# Patient Record
Sex: Female | Born: 1990
Health system: Southern US, Community
[De-identification: ages and names within clinical notes are randomized; demographics above are authoritative.]

## PROBLEM LIST (undated history)

## (undated) DIAGNOSIS — Z789 Other specified health status: Secondary | ICD-10-CM

## (undated) DIAGNOSIS — B009 Herpesviral infection, unspecified: Secondary | ICD-10-CM

## (undated) HISTORY — DX: Herpesviral infection, unspecified: B00.9

## (undated) HISTORY — DX: Other specified health status: Z78.9

## (undated) HISTORY — PX: NO PAST SURGERIES: SHX2092

---
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
## 2012-05-03 NOTE — L&D Delivery Note (Signed)
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  Presentation: Vertex; LOA; Position: Left,, Occiput,, Anterior; Station: +2.  Verbal consent: obtained from patient.  Risks and benefits discussed in detail.  Risks include, but are not limited to the risks of anesthesia, bleeding, infection, damage to maternal tissues, fetal cephalhematoma.  There is also the risk of inability to effect vaginal delivery of the head, or shoulder dystocia that cannot be resolved by established maneuvers, leading to the need for emergency cesarean section.  APGAR: 8, 9; weight .   Placenta status: Intact, Spontaneous.   Cord: 3 vessels with the following complications: None.   Anesthesia: Epidural  Instruments: Kiwi vacuum Episiotomy: None Lacerations: 2nd degree;Perineal;Labial Suture Repair: 3.0 vicryl on CT for 2nd degree, 4.0 vicryl on SH for R labial Est. Blood Loss (mL): 350  Mom to postpartum.  Baby to nursery-stable.  VAVD over 2nd degree tear. Active management of 3rd stage with pit and traction. Spon delivery of intact placenta w/ 3v cord. Repaired 2nd degree in usual manner. 3.0 V on CT and repaired labial with 4.0 V on SH with running Subq. Hemostatic at completion. EBL 350. Significant swelling of perineum.  Tawana Scale 01/04/2013, 9:21 AM

## 2012-05-03 NOTE — L&D Delivery Note (Signed)
Attended delivery and agree  Adam Phenix, MD 01/05/2013

## 2012-05-17 LAB — OB RESULTS CONSOLE ABO/RH: RH Type: POSITIVE

## 2012-05-17 LAB — OB RESULTS CONSOLE VARICELLA ZOSTER ANTIBODY, IGG: Varicella: IMMUNE

## 2012-05-17 LAB — OB RESULTS CONSOLE RUBELLA ANTIBODY, IGM: Rubella: NON-IMMUNE/NOT IMMUNE

## 2012-05-17 LAB — OB RESULTS CONSOLE GC/CHLAMYDIA
Chlamydia: NEGATIVE
Gonorrhea: NEGATIVE

## 2012-05-17 LAB — OB RESULTS CONSOLE RPR: RPR: NONREACTIVE

## 2012-05-17 LAB — OB RESULTS CONSOLE ANTIBODY SCREEN: Antibody Screen: NEGATIVE

## 2012-05-17 LAB — OB RESULTS CONSOLE HEPATITIS B SURFACE ANTIGEN: Hepatitis B Surface Ag: NEGATIVE

## 2012-07-05 ENCOUNTER — Encounter: Payer: Self-pay | Admitting: *Deleted

## 2012-07-26 ENCOUNTER — Other Ambulatory Visit: Payer: Self-pay | Admitting: Advanced Practice Midwife

## 2012-07-26 ENCOUNTER — Telehealth: Payer: Self-pay | Admitting: Obstetrics & Gynecology

## 2012-07-26 ENCOUNTER — Encounter: Payer: Self-pay | Admitting: Advanced Practice Midwife

## 2012-07-26 ENCOUNTER — Ambulatory Visit (INDEPENDENT_AMBULATORY_CARE_PROVIDER_SITE_OTHER): Payer: Medicaid Other | Admitting: Obstetrics & Gynecology

## 2012-07-26 VITALS — BP 120/60 | Wt 126.0 lb

## 2012-07-26 DIAGNOSIS — A6000 Herpesviral infection of urogenital system, unspecified: Secondary | ICD-10-CM

## 2012-07-26 DIAGNOSIS — Z3400 Encounter for supervision of normal first pregnancy, unspecified trimester: Secondary | ICD-10-CM | POA: Insufficient documentation

## 2012-07-26 DIAGNOSIS — Z3402 Encounter for supervision of normal first pregnancy, second trimester: Secondary | ICD-10-CM

## 2012-07-26 DIAGNOSIS — O98519 Other viral diseases complicating pregnancy, unspecified trimester: Secondary | ICD-10-CM

## 2012-07-26 DIAGNOSIS — Z1389 Encounter for screening for other disorder: Secondary | ICD-10-CM

## 2012-07-26 DIAGNOSIS — Z331 Pregnant state, incidental: Secondary | ICD-10-CM

## 2012-07-26 LAB — POCT URINALYSIS DIPSTICK
Glucose, UA: NEGATIVE
Ketones, UA: NEGATIVE
Nitrite, UA: NEGATIVE
Protein, UA: NEGATIVE

## 2012-07-26 MED ORDER — ACYCLOVIR 200 MG PO CAPS: 400.0000 mg | ORAL_CAPSULE | Freq: Three times a day (TID) | ORAL | Status: DC

## 2012-07-26 NOTE — Progress Notes (Signed)
Also requesting refill for acyclovir

## 2012-07-26 NOTE — Progress Notes (Signed)
Encouraged powdered supplements if no appetite.  Getting over 2nd HSV outbreak in 3 months.  Wants daily suppressive therapy.  No medicaid yet, so will rx acycolvir TID for now, then change to BID valtrex for compliance issues.    No c/o at this time.  Routine questions about pregnancy andswered.  F/U in 3 weeks for anatomy scan.

## 2012-08-01 LAB — MATERNAL SCREEN, INTEGRATED #2
AFP MoM: 0.8
AFP, Serum: 37.4 ng/mL
Age risk Down Syndrome: 1:1100 {titer}
Calculated Gestational Age: 17
Crown Rump Length: 69.1 mm
Estriol Mom: 1.15
Estriol, Free: 0.91 ng/mL
Inhibin A Dimeric: 165 pg/mL
Inhibin A MoM: 0.9
MSS Down Syndrome: <1:5000 {titer}
MSS Trisomy 18 Risk: <1:5000 {titer}
Maternal weight: 126 [lb_av]
NT MoM: 0.93
Nuchal Translucency: 1.43 mm
Number of fetuses: 1
PAPP-A MoM: 1.38
PAPP-A: 1857 ng/mL
Referring Physician NPI: 1881783975
Referring Physician Phone: 3363426063
Rish for ONTD: <1:5000 {titer}
hCG MoM: 0.16
hCG, Serum: 3.7 IU/mL

## 2012-08-09 ENCOUNTER — Ambulatory Visit: Payer: Self-pay | Admitting: Obstetrics & Gynecology

## 2012-08-17 ENCOUNTER — Other Ambulatory Visit: Payer: Self-pay

## 2012-08-17 ENCOUNTER — Ambulatory Visit (INDEPENDENT_AMBULATORY_CARE_PROVIDER_SITE_OTHER): Payer: Medicaid Other

## 2012-08-17 ENCOUNTER — Encounter: Payer: Self-pay | Admitting: Obstetrics and Gynecology

## 2012-08-17 ENCOUNTER — Ambulatory Visit (INDEPENDENT_AMBULATORY_CARE_PROVIDER_SITE_OTHER): Payer: Medicaid Other | Admitting: Obstetrics and Gynecology

## 2012-08-17 VITALS — BP 102/70 | Wt 127.4 lb

## 2012-08-17 DIAGNOSIS — Z3402 Encounter for supervision of normal first pregnancy, second trimester: Secondary | ICD-10-CM

## 2012-08-17 DIAGNOSIS — Z3400 Encounter for supervision of normal first pregnancy, unspecified trimester: Secondary | ICD-10-CM

## 2012-08-17 DIAGNOSIS — O98519 Other viral diseases complicating pregnancy, unspecified trimester: Secondary | ICD-10-CM

## 2012-08-17 DIAGNOSIS — O99330 Smoking (tobacco) complicating pregnancy, unspecified trimester: Secondary | ICD-10-CM

## 2012-08-17 DIAGNOSIS — A6000 Herpesviral infection of urogenital system, unspecified: Secondary | ICD-10-CM

## 2012-08-17 DIAGNOSIS — Z331 Pregnant state, incidental: Secondary | ICD-10-CM

## 2012-08-17 DIAGNOSIS — Z1389 Encounter for screening for other disorder: Secondary | ICD-10-CM

## 2012-08-17 LAB — US OB COMP + 14 WK
Abdominal Circumference: 14.01 cm
Biparietal Diameter: 4.54 cm
Femur Length: 3.22
Head Circumference: 17.43 cm

## 2012-08-17 NOTE — Progress Notes (Signed)
Pt unable to void for urine samples, fetal u/s reviewed, appropriate anatomy scan.consistent with current dating

## 2012-08-17 NOTE — Progress Notes (Signed)
U/S(19+5wks)-single active fetus, meas c/w dates, fluid wnl, ant gr 0 plac, cx long and closed, bilateral adnexa wnl, no major abnl noted, female fetus

## 2012-09-14 ENCOUNTER — Ambulatory Visit (INDEPENDENT_AMBULATORY_CARE_PROVIDER_SITE_OTHER): Payer: Medicaid Other | Admitting: Advanced Practice Midwife

## 2012-09-14 ENCOUNTER — Encounter: Payer: Self-pay | Admitting: Advanced Practice Midwife

## 2012-09-14 VITALS — BP 100/60 | Wt 131.0 lb

## 2012-09-14 DIAGNOSIS — Z331 Pregnant state, incidental: Secondary | ICD-10-CM

## 2012-09-14 DIAGNOSIS — Z3402 Encounter for supervision of normal first pregnancy, second trimester: Secondary | ICD-10-CM

## 2012-09-14 DIAGNOSIS — Z1389 Encounter for screening for other disorder: Secondary | ICD-10-CM

## 2012-09-14 DIAGNOSIS — O98519 Other viral diseases complicating pregnancy, unspecified trimester: Secondary | ICD-10-CM

## 2012-09-14 LAB — POCT URINALYSIS DIPSTICK
Blood, UA: NEGATIVE
Glucose, UA: NEGATIVE
Ketones, UA: NEGATIVE
Nitrite, UA: NEGATIVE
Protein, UA: NEGATIVE

## 2012-09-14 NOTE — Progress Notes (Signed)
No c/o at this time.  Routine questions about pregnancy answered.  F/U in 4 weeks for PN2/LROB.  

## 2012-09-14 NOTE — Patient Instructions (Signed)
Nothing to eat or drink after midnight before your next appointment & plan to be here for 2 hours (for your sugar test).  

## 2012-10-12 ENCOUNTER — Other Ambulatory Visit: Payer: Medicaid Other

## 2012-10-12 ENCOUNTER — Ambulatory Visit (INDEPENDENT_AMBULATORY_CARE_PROVIDER_SITE_OTHER): Payer: Medicaid Other | Admitting: Adult Health

## 2012-10-12 ENCOUNTER — Encounter: Payer: Self-pay | Admitting: Adult Health

## 2012-10-12 VITALS — BP 94/62 | Wt 135.0 lb

## 2012-10-12 DIAGNOSIS — Z331 Pregnant state, incidental: Secondary | ICD-10-CM

## 2012-10-12 DIAGNOSIS — Z3400 Encounter for supervision of normal first pregnancy, unspecified trimester: Secondary | ICD-10-CM

## 2012-10-12 DIAGNOSIS — O98519 Other viral diseases complicating pregnancy, unspecified trimester: Secondary | ICD-10-CM

## 2012-10-12 DIAGNOSIS — Z1389 Encounter for screening for other disorder: Secondary | ICD-10-CM

## 2012-10-12 LAB — CBC
HCT: 33.2 % — ABNORMAL LOW (ref 36.0–46.0)
Hemoglobin: 11.4 g/dL — ABNORMAL LOW (ref 12.0–15.0)
MCH: 31.8 pg (ref 26.0–34.0)
MCHC: 34.3 g/dL (ref 30.0–36.0)
MCV: 92.5 fL (ref 78.0–100.0)
Platelets: 248 10*3/uL (ref 150–400)
RBC: 3.59 MIL/uL — ABNORMAL LOW (ref 3.87–5.11)
RDW: 13.4 % (ref 11.5–15.5)
WBC: 13.1 10*3/uL — ABNORMAL HIGH (ref 4.0–10.5)

## 2012-10-12 LAB — POCT URINALYSIS DIPSTICK
Blood, UA: NEGATIVE
Glucose, UA: NEGATIVE
Ketones, UA: NEGATIVE
Leukocytes, UA: NEGATIVE
Nitrite, UA: NEGATIVE
Protein, UA: NEGATIVE

## 2012-10-12 NOTE — Progress Notes (Signed)
PN 2 today, no c/o at this time.

## 2012-10-12 NOTE — Progress Notes (Signed)
No bleeding or discharge. Has good fetal movement, no complaints.F/U 4 weeks.

## 2012-10-12 NOTE — Patient Instructions (Addendum)
Pregnancy - Second Trimester The second trimester of pregnancy (3 to 6 months) is a period of rapid growth for you and your baby. At the end of the sixth month, your baby is about 9 inches long and weighs 1 1/2 pounds. You will begin to feel the baby move between 18 and 20 weeks of the pregnancy. This is called quickening. Weight gain is faster. A clear fluid (colostrum) may leak out of your breasts. You may feel small contractions of the womb (uterus). This is known as false labor or Braxton-Hicks contractions. This is like a practice for labor when the baby is ready to be born. Usually, the problems with morning sickness have usually passed by the end of your first trimester. Some women develop small dark blotches (called cholasma, mask of pregnancy) on their face that usually goes away after the baby is born. Exposure to the sun makes the blotches worse. Acne may also develop in some pregnant women and pregnant women who have acne, may find that it goes away. PRENATAL EXAMS  Blood work may continue to be done during prenatal exams. These tests are done to check on your health and the probable health of your baby. Blood work is used to follow your blood levels (hemoglobin). Anemia (low hemoglobin) is common during pregnancy. Iron and vitamins are given to help prevent this. You will also be checked for diabetes between 24 and 28 weeks of the pregnancy. Some of the previous blood tests may be repeated.  The size of the uterus is measured during each visit. This is to make sure that the baby is continuing to grow properly according to the dates of the pregnancy.  Your blood pressure is checked every prenatal visit. This is to make sure you are not getting toxemia.  Your urine is checked to make sure you do not have an infection, diabetes or protein in the urine.  Your weight is checked often to make sure gains are happening at the suggested rate. This is to ensure that both you and your baby are growing  normally.  Sometimes, an ultrasound is performed to confirm the proper growth and development of the baby. This is a test which bounces harmless sound waves off the baby so your caregiver can more accurately determine due dates. Sometimes, a test is done on the amniotic fluid surrounding the baby. This test is called an amniocentesis. The amniotic fluid is obtained by sticking a needle into the belly (abdomen). This is done to check the chromosomes in instances where there is a concern about possible genetic problems with the baby. It is also sometimes done near the end of pregnancy if an early delivery is required. In this case, it is done to help make sure the baby's lungs are mature enough for the baby to live outside of the womb. CHANGES OCCURING IN THE SECOND TRIMESTER OF PREGNANCY Your body goes through many changes during pregnancy. They vary from person to person. Talk to your caregiver about changes you notice that you are concerned about.  During the second trimester, you will likely have an increase in your appetite. It is normal to have cravings for certain foods. This varies from person to person and pregnancy to pregnancy.  Your lower abdomen will begin to bulge.  You may have to urinate more often because the uterus and baby are pressing on your bladder. It is also common to get more bladder infections during pregnancy. You can help this by drinking lots of fluids  and emptying your bladder before and after intercourse.  You may begin to get stretch marks on your hips, abdomen, and breasts. These are normal changes in the body during pregnancy. There are no exercises or medicines to take that prevent this change.  You may begin to develop swollen and bulging veins (varicose veins) in your legs. Wearing support hose, elevating your feet for 15 minutes, 3 to 4 times a day and limiting salt in your diet helps lessen the problem.  Heartburn may develop as the uterus grows and pushes up  against the stomach. Antacids recommended by your caregiver helps with this problem. Also, eating smaller meals 4 to 5 times a day helps.  Constipation can be treated with a stool softener or adding bulk to your diet. Drinking lots of fluids, and eating vegetables, fruits, and whole grains are helpful.  Exercising is also helpful. If you have been very active up until your pregnancy, most of these activities can be continued during your pregnancy. If you have been less active, it is helpful to start an exercise program such as walking.  Hemorrhoids may develop at the end of the second trimester. Warm sitz baths and hemorrhoid cream recommended by your caregiver helps hemorrhoid problems.  Backaches may develop during this time of your pregnancy. Avoid heavy lifting, wear low heal shoes, and practice good posture to help with backache problems.  Some pregnant women develop tingling and numbness of their hand and fingers because of swelling and tightening of ligaments in the wrist (carpel tunnel syndrome). This goes away after the baby is born.  As your breasts enlarge, you may have to get a bigger bra. Get a comfortable, cotton, support bra. Do not get a nursing bra until the last month of the pregnancy if you will be nursing the baby.  You may get a dark line from your belly button to the pubic area called the linea nigra.  You may develop rosy cheeks because of increase blood flow to the face.  You may develop spider looking lines of the face, neck, arms, and chest. These go away after the baby is born. HOME CARE INSTRUCTIONS   It is extremely important to avoid all smoking, herbs, alcohol, and unprescribed drugs during your pregnancy. These chemicals affect the formation and growth of the baby. Avoid these chemicals throughout the pregnancy to ensure the delivery of a healthy infant.  Most of your home care instructions are the same as suggested for the first trimester of your pregnancy.  Keep your caregiver's appointments. Follow your caregiver's instructions regarding medicine use, exercise, and diet.  During pregnancy, you are providing food for you and your baby. Continue to eat regular, well-balanced meals. Choose foods such as meat, fish, milk and other low fat dairy products, vegetables, fruits, and whole-grain breads and cereals. Your caregiver will tell you of the ideal weight gain.  A physical sexual relationship may be continued up until near the end of pregnancy if there are no other problems. Problems could include early (premature) leaking of amniotic fluid from the membranes, vaginal bleeding, abdominal pain, or other medical or pregnancy problems.  Exercise regularly if there are no restrictions. Check with your caregiver if you are unsure of the safety of some of your exercises. The greatest weight gain will occur in the last 2 trimesters of pregnancy. Exercise will help you:  Control your weight.  Get you in shape for labor and delivery.  Lose weight after you have the baby.  Wear  a good support or jogging bra for breast tenderness during pregnancy. This may help if worn during sleep. Pads or tissues may be used in the bra if you are leaking colostrum.  Do not use hot tubs, steam rooms or saunas throughout the pregnancy.  Wear your seat belt at all times when driving. This protects you and your baby if you are in an accident.  Avoid raw meat, uncooked cheese, cat litter boxes, and soil used by cats. These carry germs that can cause birth defects in the baby.  The second trimester is also a good time to visit your dentist for your dental health if this has not been done yet. Getting your teeth cleaned is okay. Use a soft toothbrush. Brush gently during pregnancy.  It is easier to leak urine during pregnancy. Tightening up and strengthening the pelvic muscles will help with this problem. Practice stopping your urination while you are going to the bathroom.  These are the same muscles you need to strengthen. It is also the muscles you would use as if you were trying to stop from passing gas. You can practice tightening these muscles up 10 times a set and repeating this about 3 times per day. Once you know what muscles to tighten up, do not perform these exercises during urination. It is more likely to contribute to an infection by backing up the urine.  Ask for help if you have financial, counseling, or nutritional needs during pregnancy. Your caregiver will be able to offer counseling for these needs as well as refer you for other special needs.  Your skin may become oily. If so, wash your face with mild soap, use non-greasy moisturizer and oil or cream based makeup. MEDICINES AND DRUG USE IN PREGNANCY  Take prenatal vitamins as directed. The vitamin should contain 1 milligram of folic acid. Keep all vitamins out of reach of children. Only a couple vitamins or tablets containing iron may be fatal to a baby or young child when ingested.  Avoid use of all medicines, including herbs, over-the-counter medicines, not prescribed or suggested by your caregiver. Only take over-the-counter or prescription medicines for pain, discomfort, or fever as directed by your caregiver. Do not use aspirin.  Let your caregiver also know about herbs you may be using.  Alcohol is related to a number of birth defects. This includes fetal alcohol syndrome. All alcohol, in any form, should be avoided completely. Smoking will cause low birth rate and premature babies.  Street or illegal drugs are very harmful to the baby. They are absolutely forbidden. A baby born to an addicted mother will be addicted at birth. The baby will go through the same withdrawal an adult does. SEEK MEDICAL CARE IF:  You have any concerns or worries during your pregnancy. It is better to call with your questions if you feel they cannot wait, rather than worry about them. SEEK IMMEDIATE MEDICAL CARE  IF:   An unexplained oral temperature above 102 F (38.9 C) develops, or as your caregiver suggests.  You have leaking of fluid from the vagina (birth canal). If leaking membranes are suspected, take your temperature and tell your caregiver of this when you call.  There is vaginal spotting, bleeding, or passing clots. Tell your caregiver of the amount and how many pads are used. Light spotting in pregnancy is common, especially following intercourse.  You develop a bad smelling vaginal discharge with a change in the color from clear to white.  You continue to feel  sick to your stomach (nauseated) and have no relief from remedies suggested. You vomit blood or coffee ground-like materials.  You lose more than 2 pounds of weight or gain more than 2 pounds of weight over 1 week, or as suggested by your caregiver.  You notice swelling of your face, hands, feet, or legs.  You get exposed to [place] measles and have never had them.  You are exposed to fifth disease or chickenpox.  You develop belly (abdominal) pain. Round ligament discomfort is a common non-cancerous (benign) cause of abdominal pain in pregnancy. Your caregiver still must evaluate you.  You develop a bad headache that does not go away.  You develop fever, diarrhea, pain with urination, or shortness of breath.  You develop visual problems, blurry, or double vision.  You fall or are in a car accident or any kind of trauma.  There is mental or physical violence at home. Document Released: 04/13/2001 Document Revised: 01/12/2012 Document Reviewed: 10/16/2008 [facility] Patient Information 2014 [place], [place]. Reurn in 4 weeks

## 2012-10-13 LAB — ANTIBODY SCREEN: Antibody Screen: NEGATIVE

## 2012-10-13 LAB — GLUCOSE TOLERANCE, 2 HOURS W/ 1HR
Glucose, 1 hour: 85 mg/dL (ref 70–170)
Glucose, 2 hour: 82 mg/dL (ref 70–139)
Glucose, Fasting: 72 mg/dL (ref 70–99)

## 2012-10-13 LAB — RPR

## 2012-10-13 LAB — HSV 2 ANTIBODY, IGG: HSV 2 Glycoprotein G Ab, IgG: 4.31 IV — ABNORMAL HIGH

## 2012-10-13 LAB — HIV ANTIBODY (ROUTINE TESTING W REFLEX): HIV: NONREACTIVE

## 2012-11-09 ENCOUNTER — Encounter: Payer: Self-pay | Admitting: Obstetrics & Gynecology

## 2012-11-09 ENCOUNTER — Ambulatory Visit (INDEPENDENT_AMBULATORY_CARE_PROVIDER_SITE_OTHER): Payer: Medicaid Other | Admitting: Obstetrics & Gynecology

## 2012-11-09 VITALS — BP 100/70 | Wt 137.0 lb

## 2012-11-09 DIAGNOSIS — Z1389 Encounter for screening for other disorder: Secondary | ICD-10-CM

## 2012-11-09 DIAGNOSIS — Z331 Pregnant state, incidental: Secondary | ICD-10-CM

## 2012-11-09 DIAGNOSIS — O98519 Other viral diseases complicating pregnancy, unspecified trimester: Secondary | ICD-10-CM

## 2012-11-09 DIAGNOSIS — Z3403 Encounter for supervision of normal first pregnancy, third trimester: Secondary | ICD-10-CM

## 2012-11-09 LAB — POCT URINALYSIS DIPSTICK
Blood, UA: NEGATIVE
Glucose, UA: NEGATIVE
Ketones, UA: NEGATIVE
Nitrite, UA: NEGATIVE

## 2012-11-09 NOTE — Progress Notes (Signed)
BP weight and urine results all reviewed and noted. Patient reports good fetal movement, denies any bleeding and no rupture of membranes symptoms or regular contractions. Patient is without complaints. All questions were answered.  

## 2012-11-09 NOTE — Patient Instructions (Signed)
Epidural Risks and Benefits The continuous putting in (infusion) of local anesthetics through a long, narrow, hollow plastic tube (catheter)/needle into the lower (lumbar) area of your spine is commonly called an epidural. This means outside the covering of the spinal cord. The epidural catheter is placed in the space on the outside of the membrane that covers the spinal cord. The anesthetic medicine numbs the nerves of the spinal cord in the epidural space. There is also a spinal/epidural anesthetic using two needles and a catheter. The medication is first placed in the spinal canal. Then that needle is removed and a catheter is placed in the epidural space through the second needle for continuous anesthesia. This seems to be the most popular type of regional anesthesia used now. This is sometimes given for pain management to women who are giving birth. Spinal and epidural anesthesia are called regional anesthesia because they numb a certain region of the body. While it is an effective pain management tool, some reasons not to use this include:  Restricted mobility: The tubes and monitors connected to you do not allow for much moving around.  Increased likelihood of bladder catheterization, oxytocin administration, and internal monitoring. This means a tube (catheter) may have to be put into the bladder to drain the urine. Uterine contractions can become weaker and less frequent. They also may have a higher use of oxytocin than mothers not having regional anesthesia.  Increased likelihood of operative delivery: This includes the use of or need for forceps, vacuum extractor, episiotomy, or cesarean delivery. When the dose is too large, or when it sinks down into the "tailbone" (sacral) region of the body, the perineum and the birth canal (vagina) are anesthetized. Anesthetic is injected into this area late in labor to deaden all sensation. When it "accidentally" happens earlier in labor, the muscles of the  pelvic floor are relaxed too early. This interferes with the normal flexion and rotation of the baby's head as it passes through the birth canal. This interference can lead to abnormal presentations that are more dangerous for the baby.  Must use an automatic blood pressure cuff throughout labor. This is a cuff that automatically takes your blood pressure at regular intervals. SHORT TERM MATERNAL RISKS  Dural puncture - The dura is one of the membranes surrounding the spinal cord. If the anesthetic medication gets into the spinal canal through a dural puncture, it can result in a spinal anesthetic and spinal headache. Spinal headaches are treated with an epidural blood patch to cover the punctured area.  Low blood pressure (hypotension) - Nearly one third of women with an epidural will develop low blood pressure. The ways that patients must lay during the epidural can make this worse. Their position is limited because they will be unable to move their legs easily for the time of the anesthetic. Low blood pressure is also a risk for the baby. If the baby does not get enough oxygen from the mom's blood, it can result in an emergency Cesarean section. This means the baby is delivered by an operation through a cut by the surgeon (incision) on the belly of the mother.  Nausea, vomiting, and prolonged shivering.  Prolonged labor - With large doses of anesthetic medication, the patient loses the desire and the ability to bear down and push. This results in an increased use of forceps and vacuum extractions, compared to women having unmedicated deliveries.  Uneven, incomplete or non-existent pain relief. Sometimes the epidural does not work well and   additional medications may be needed for pain relief.  Difficulty breathing well or paralysis if the level of anesthesia goes too high in the spine.  Convulsions - If the anesthetic agent accidentally is injected into a blood vessel it can cause convulsions and  loss of consciousness.  Toxic drug reactions.  Septic meningitis - An abscess can form at the site where the epidural catheter is placed. If this spreads into the spinal canal it can cause meningitis.  Allergic reaction - This causes blood pressure to become too low and other medications and fluids must be given to bring the blood pressure up. Also rashes and difficulty breathing may develop.  Cardiac arrest - This is rare but real threat to the life of the mother and baby.  Fever is common.  Itching that is easily treated.  Spinal hematoma. LONG TERM MATERNAL RISKS  Neurological complications - A nerve problem called Horner's syndrome can develop with epidural anesthesia for vaginal delivery. It is impossible to predict which patients will develop a Horner's syndrome. Even the nerves to the face can be blocked, temporarily or permanently. Tremors and shakes can occur.  Paresthesia ("pins and needles"). This is a feeling that comes from inflammation of a nerve.  Dizziness and fainting can become a problem after epidurals. This is usually only for a couple of days. RISKS TO BABY  Direct drug toxicity.  Fetal distress, abnormal fetal heart rate (FHR) (can lead to emergency cesarean). This is especially true if the anesthetic gets into the mother's blood stream or too much medication is put into the epidural. REASONS NOT TO HAVE EPIDURAL ANESTHESIA  Increased costs.  The mother has a low blood pressure.  There are blood clotting problems.  A brain tumor is present.  There is an infection in the blood stream.  A skin infection at the needle site.  A tattoo at the needle site. BENEFITS  Regional anesthesia is the most effective pain relief for labor and delivery.  It is the best anesthetic for preeclampsia and eclampsia.  There is better pain control after delivery (vaginal or cesarean).  When done correctly, no medication gets to the baby.  Sooner ambulation after  delivery.  It can be left in place during all of labor.  You can be awake during a Cesarean delivery and see the baby immediately after delivery. AFTER THE PROCEDURE   You will be kept in bed for several hours to prevent headaches.  You will be kept in bed until your legs are no longer numb and it is safe to walk.  The length of time you spend in the hospital will depend on the type of surgery or procedure you have had.  The epidural catheter is removed after you no longer need it for pain. HOME CARE INSTRUCTIONS   Do not drive or operate any kind of machinery for at least 24 hours. Make sure there is someone to drive you home.  Do not drink alcohol for at least 24 hours after the anesthesia.  Do not make important decisions for at least 24 hours after the anesthesia.  Drink lots of fluids.  Return to your normal diet.  Keep all your postoperative appointments as scheduled. SEEK IMMEDIATE MEDICAL CARE IF:  You develop a fever or temperature over 98.6 F (37 C).  You have a persistent headache.  You develop dizziness, fainting or lightheadedness.  You develop weakness, numbness or tingling in your arms or legs.  You have a skin rash.  You   have difficulty breathing  You have a stiff neck with or without stiff back.  You develop chest pain. Document Released: 04/19/2005 Document Revised: 07/12/2011 Document Reviewed: 05/27/2008 Exit[employer] Patient Information 2014 [employer].  

## 2012-11-23 ENCOUNTER — Encounter: Payer: Self-pay | Admitting: Obstetrics & Gynecology

## 2012-11-23 ENCOUNTER — Ambulatory Visit (INDEPENDENT_AMBULATORY_CARE_PROVIDER_SITE_OTHER): Payer: Medicaid Other | Admitting: Obstetrics & Gynecology

## 2012-11-23 VITALS — BP 110/60 | Wt 137.0 lb

## 2012-11-23 DIAGNOSIS — Z331 Pregnant state, incidental: Secondary | ICD-10-CM

## 2012-11-23 DIAGNOSIS — O98519 Other viral diseases complicating pregnancy, unspecified trimester: Secondary | ICD-10-CM

## 2012-11-23 DIAGNOSIS — Z1389 Encounter for screening for other disorder: Secondary | ICD-10-CM

## 2012-11-23 DIAGNOSIS — O99019 Anemia complicating pregnancy, unspecified trimester: Secondary | ICD-10-CM

## 2012-11-23 LAB — POCT URINALYSIS DIPSTICK
Blood, UA: NEGATIVE
Glucose, UA: NEGATIVE
Ketones, UA: NEGATIVE
Nitrite, UA: NEGATIVE
Protein, UA: NEGATIVE

## 2012-11-23 MED ORDER — ACYCLOVIR 200 MG PO CAPS: 400.0000 mg | ORAL_CAPSULE | Freq: Three times a day (TID) | ORAL | Status: DC

## 2012-11-23 NOTE — Progress Notes (Signed)
PT HAVING BRAXTTON  HICKS CONTRACTIONS. WANT TO KNOW WHEN TO START TAKING ACYCLOVIR.

## 2012-11-23 NOTE — Patient Instructions (Signed)

## 2012-11-23 NOTE — Progress Notes (Signed)
BP weight and urine results all reviewed and noted. Patient reports good fetal movement, denies any bleeding and no rupture of membranes symptoms or regular contractions. Patient is without complaints. All questions were answered.  

## 2012-11-30 ENCOUNTER — Encounter: Payer: Medicaid Other | Admitting: Women's Health

## 2012-12-11 ENCOUNTER — Ambulatory Visit (INDEPENDENT_AMBULATORY_CARE_PROVIDER_SITE_OTHER): Payer: Medicaid Other | Admitting: Women's Health

## 2012-12-11 VITALS — BP 110/70 | Wt 140.0 lb

## 2012-12-11 DIAGNOSIS — Z1389 Encounter for screening for other disorder: Secondary | ICD-10-CM

## 2012-12-11 DIAGNOSIS — Z3403 Encounter for supervision of normal first pregnancy, third trimester: Secondary | ICD-10-CM

## 2012-12-11 DIAGNOSIS — A6000 Herpesviral infection of urogenital system, unspecified: Secondary | ICD-10-CM

## 2012-12-11 DIAGNOSIS — O99019 Anemia complicating pregnancy, unspecified trimester: Secondary | ICD-10-CM

## 2012-12-11 DIAGNOSIS — O98519 Other viral diseases complicating pregnancy, unspecified trimester: Secondary | ICD-10-CM

## 2012-12-11 DIAGNOSIS — Z331 Pregnant state, incidental: Secondary | ICD-10-CM

## 2012-12-11 LAB — POCT URINALYSIS DIPSTICK
Blood, UA: NEGATIVE
Glucose, UA: NEGATIVE
Ketones, UA: NEGATIVE
Leukocytes, UA: NEGATIVE
Nitrite, UA: NEGATIVE
Protein, UA: NEGATIVE

## 2012-12-11 NOTE — Progress Notes (Signed)
Reports good fm. Denies regular uc's, lof, vb, urinary frequency, urgency, hesitancy, or dysuria.  No complaints.  Reviewed ptl s/s, fetal kick counts.  All questions answered. F/U in 1wk for visit and GBS.

## 2012-12-11 NOTE — Patient Instructions (Addendum)
Contraception Choices  Contraception (birth control) is the use of any methods or devices to prevent pregnancy. Below are some methods to help avoid pregnancy.  HORMONAL METHODS   · Contraceptive implant. This is a thin, plastic tube containing progesterone hormone. It does not contain estrogen hormone. Your caregiver inserts the tube in the inner part of the upper arm. The tube can remain in place for up to 3 years. After 3 years, the implant must be removed. The implant prevents the ovaries from releasing an egg (ovulation), thickens the cervical mucus which prevents sperm from entering the uterus, and thins the lining of the inside of the uterus.  · Progesterone-only injections. These injections are given every 3 months by your caregiver to prevent pregnancy. This synthetic progesterone hormone stops the ovaries from releasing eggs. It also thickens cervical mucus and changes the uterine lining. This makes it harder for sperm to survive in the uterus.  · Birth control pills. These pills contain estrogen and progesterone hormone. They work by stopping the egg from forming in the ovary (ovulation). Birth control pills are prescribed by a caregiver. Birth control pills can also be used to treat heavy periods.  · Minipill. This type of birth control pill contains only the progesterone hormone. They are taken every day of each month and must be prescribed by your caregiver.  · Birth control patch. The patch contains hormones similar to those in birth control pills. It must be changed once a week and is prescribed by a caregiver.  · Vaginal ring. The ring contains hormones similar to those in birth control pills. It is left in the vagina for 3 weeks, removed for 1 week, and then a new one is put back in place. The patient must be comfortable inserting and removing the ring from the vagina. A caregiver's prescription is necessary.  · Emergency contraception. Emergency contraceptives prevent pregnancy after unprotected  sexual intercourse. This pill can be taken right after sex or up to 5 days after unprotected sex. It is most effective the sooner you take the pills after having sexual intercourse. Emergency contraceptive pills are available without a prescription. Check with your pharmacist. Do not use emergency contraception as your only form of birth control.  BARRIER METHODS   · Female condom. This is a thin sheath (latex or rubber) that is worn over the penis during sexual intercourse. It can be used with spermicide to increase effectiveness.  · Female condom. This is a soft, loose-fitting sheath that is put into the vagina before sexual intercourse.  · Diaphragm. This is a soft, latex, dome-shaped barrier that must be fitted by a caregiver. It is inserted into the vagina, along with a spermicidal jelly. It is inserted before intercourse. The diaphragm should be left in the vagina for 6 to 8 hours after intercourse.  · Cervical cap. This is a round, soft, latex or plastic cup that fits over the cervix and must be fitted by a caregiver. The cap can be left in place for up to 48 hours after intercourse.  · Sponge. This is a soft, circular piece of polyurethane foam. The sponge has spermicide in it. It is inserted into the vagina after wetting it and before sexual intercourse.  · Spermicides. These are chemicals that kill or block sperm from entering the cervix and uterus. They come in the form of creams, jellies, suppositories, foam, or tablets. They do not require a prescription. They are inserted into the vagina with an applicator before having sexual intercourse.   The process must be repeated every time you have sexual intercourse.  INTRAUTERINE CONTRACEPTION  · Intrauterine device (IUD). This is a T-shaped device that is put in a woman's uterus during a menstrual period to prevent pregnancy. There are 2 types:  · Copper IUD. This type of IUD is wrapped in copper wire and is placed inside the uterus. Copper makes the uterus and  fallopian tubes produce a fluid that kills sperm. It can stay in place for 10 years.  · Hormone IUD. This type of IUD contains the hormone progestin (synthetic progesterone). The hormone thickens the cervical mucus and prevents sperm from entering the uterus, and it also thins the uterine lining to prevent implantation of a fertilized egg. The hormone can weaken or kill the sperm that get into the uterus. It can stay in place for 5 years.  PERMANENT METHODS OF CONTRACEPTION  · Female tubal ligation. This is when the woman's fallopian tubes are surgically sealed, tied, or blocked to prevent the egg from traveling to the uterus.  · Female sterilization. This is when the female has the tubes that carry sperm tied off (vasectomy). This blocks sperm from entering the vagina during sexual intercourse. After the procedure, the man can still ejaculate fluid (semen).  NATURAL PLANNING METHODS  · Natural family planning. This is not having sexual intercourse or using a barrier method (condom, diaphragm, cervical cap) on days the woman could become pregnant.  · Calendar method. This is keeping track of the length of each menstrual cycle and identifying when you are fertile.  · Ovulation method. This is avoiding sexual intercourse during ovulation.  · Symptothermal method. This is avoiding sexual intercourse during ovulation, using a thermometer and ovulation symptoms.  · Post-ovulation method. This is timing sexual intercourse after you have ovulated.  Regardless of which type or method of contraception you choose, it is important that you use condoms to protect against the transmission of sexually transmitted diseases (STDs). Talk with your caregiver about which form of contraception is most appropriate for you.  Document Released: 04/19/2005 Document Revised: 07/12/2011 Document Reviewed: 08/26/2010  [employer]® Patient Information ©2014 [employer].

## 2012-12-11 NOTE — Progress Notes (Signed)
Pt here today for routine visit. Pt denies any problems or concerns.

## 2012-12-18 ENCOUNTER — Ambulatory Visit (INDEPENDENT_AMBULATORY_CARE_PROVIDER_SITE_OTHER): Payer: Medicaid Other | Admitting: Women's Health

## 2012-12-18 ENCOUNTER — Encounter: Payer: Self-pay | Admitting: Women's Health

## 2012-12-18 VITALS — BP 108/64 | Wt 138.5 lb

## 2012-12-18 DIAGNOSIS — O98519 Other viral diseases complicating pregnancy, unspecified trimester: Secondary | ICD-10-CM

## 2012-12-18 DIAGNOSIS — Z1389 Encounter for screening for other disorder: Secondary | ICD-10-CM

## 2012-12-18 DIAGNOSIS — Z331 Pregnant state, incidental: Secondary | ICD-10-CM

## 2012-12-18 DIAGNOSIS — Z3403 Encounter for supervision of normal first pregnancy, third trimester: Secondary | ICD-10-CM

## 2012-12-18 DIAGNOSIS — O99019 Anemia complicating pregnancy, unspecified trimester: Secondary | ICD-10-CM

## 2012-12-18 LAB — POCT URINALYSIS DIPSTICK
Glucose, UA: NEGATIVE
Nitrite, UA: NEGATIVE
Protein, UA: NEGATIVE

## 2012-12-18 NOTE — Patient Instructions (Signed)

## 2012-12-18 NOTE — Progress Notes (Signed)
Reports good fm. Denies uc's, lof, vb, urinary frequency, urgency, hesitancy, or dysuria.  No complaints.  Reviewed labor s/s, FKC.  All questions answered. GBS, GC/CH today F/U in 1wk for visit.

## 2012-12-19 LAB — GC/CHLAMYDIA PROBE AMP: CT Probe RNA: NEGATIVE

## 2012-12-22 ENCOUNTER — Encounter: Payer: Self-pay | Admitting: Women's Health

## 2012-12-25 ENCOUNTER — Ambulatory Visit (INDEPENDENT_AMBULATORY_CARE_PROVIDER_SITE_OTHER): Payer: Medicaid Other | Admitting: Women's Health

## 2012-12-25 ENCOUNTER — Encounter: Payer: Self-pay | Admitting: Women's Health

## 2012-12-25 VITALS — BP 100/62 | Wt 141.0 lb

## 2012-12-25 DIAGNOSIS — O98519 Other viral diseases complicating pregnancy, unspecified trimester: Secondary | ICD-10-CM

## 2012-12-25 DIAGNOSIS — Z331 Pregnant state, incidental: Secondary | ICD-10-CM

## 2012-12-25 DIAGNOSIS — Z1389 Encounter for screening for other disorder: Secondary | ICD-10-CM

## 2012-12-25 DIAGNOSIS — O99019 Anemia complicating pregnancy, unspecified trimester: Secondary | ICD-10-CM

## 2012-12-25 DIAGNOSIS — Z3403 Encounter for supervision of normal first pregnancy, third trimester: Secondary | ICD-10-CM

## 2012-12-25 LAB — POCT URINALYSIS DIPSTICK
Blood, UA: NEGATIVE
Glucose, UA: NEGATIVE
Ketones, UA: NEGATIVE

## 2012-12-25 NOTE — Progress Notes (Signed)
Reports good fm. Denies uc's, lof, vb, urinary frequency, urgency, hesitancy, or dysuria.  No complaints.  Reviewed ptl s/s, fkc.  All questions answered. F/U in 1wk for visit.

## 2012-12-25 NOTE — Progress Notes (Signed)
Pt here today for routine visit. Pt denies any problems or concerns at this time.  

## 2013-01-02 ENCOUNTER — Encounter: Payer: Self-pay | Admitting: Women's Health

## 2013-01-02 ENCOUNTER — Ambulatory Visit (INDEPENDENT_AMBULATORY_CARE_PROVIDER_SITE_OTHER): Payer: Medicaid Other | Admitting: Women's Health

## 2013-01-02 VITALS — BP 100/60 | Wt 139.0 lb

## 2013-01-02 DIAGNOSIS — O99019 Anemia complicating pregnancy, unspecified trimester: Secondary | ICD-10-CM

## 2013-01-02 DIAGNOSIS — Z3403 Encounter for supervision of normal first pregnancy, third trimester: Secondary | ICD-10-CM

## 2013-01-02 DIAGNOSIS — O98519 Other viral diseases complicating pregnancy, unspecified trimester: Secondary | ICD-10-CM

## 2013-01-02 DIAGNOSIS — Z331 Pregnant state, incidental: Secondary | ICD-10-CM

## 2013-01-02 DIAGNOSIS — Z1389 Encounter for screening for other disorder: Secondary | ICD-10-CM

## 2013-01-02 LAB — POCT URINALYSIS DIPSTICK
Glucose, UA: NEGATIVE
Protein, UA: NEGATIVE

## 2013-01-02 NOTE — Patient Instructions (Signed)

## 2013-01-02 NOTE — Progress Notes (Signed)
Reports good fm. Denies uc's, lof, vb, urinary frequency, urgency, hesitancy, or dysuria.  No complaints.  Wants to come out of work- note given. SVE: 1.5-2/70/-1, anterior, soft, vtx. Offered membrane sweeping, discussed r/b- pt decided to proceed, so membranes swept. Reviewed labor s/s, fkc.  All questions answered. F/U in 1wk for visit.

## 2013-01-03 ENCOUNTER — Inpatient Hospital Stay (HOSPITAL_COMMUNITY)
Admission: AD | Admit: 2013-01-03 | Discharge: 2013-01-03 | Disposition: A | Discharge status: Home / Self Care | Payer: Medicaid Other | Source: Ambulatory Visit | Attending: Family Medicine | Admitting: Family Medicine

## 2013-01-03 ENCOUNTER — Encounter (HOSPITAL_COMMUNITY): Payer: Self-pay

## 2013-01-03 ENCOUNTER — Encounter (HOSPITAL_COMMUNITY): Payer: Self-pay | Admitting: *Deleted

## 2013-01-03 ENCOUNTER — Inpatient Hospital Stay (HOSPITAL_COMMUNITY)
Admission: AD | Admit: 2013-01-03 | Discharge: 2013-01-05 | DRG: 774 | Disposition: A | Discharge status: Home / Self Care | Payer: Medicaid Other | Source: Ambulatory Visit | Attending: Obstetrics & Gynecology | Admitting: Obstetrics & Gynecology

## 2013-01-03 DIAGNOSIS — O98519 Other viral diseases complicating pregnancy, unspecified trimester: Secondary | ICD-10-CM | POA: Diagnosis present

## 2013-01-03 DIAGNOSIS — R109 Unspecified abdominal pain: Secondary | ICD-10-CM | POA: Insufficient documentation

## 2013-01-03 DIAGNOSIS — Z3403 Encounter for supervision of normal first pregnancy, third trimester: Secondary | ICD-10-CM

## 2013-01-03 DIAGNOSIS — N949 Unspecified condition associated with female genital organs and menstrual cycle: Secondary | ICD-10-CM

## 2013-01-03 DIAGNOSIS — O99891 Other specified diseases and conditions complicating pregnancy: Secondary | ICD-10-CM | POA: Insufficient documentation

## 2013-01-03 DIAGNOSIS — A6000 Herpesviral infection of urogenital system, unspecified: Secondary | ICD-10-CM | POA: Diagnosis present

## 2013-01-03 DIAGNOSIS — O479 False labor, unspecified: Secondary | ICD-10-CM | POA: Insufficient documentation

## 2013-01-03 LAB — URINALYSIS, ROUTINE W REFLEX MICROSCOPIC
Glucose, UA: 250 mg/dL — AB
Ketones, ur: NEGATIVE mg/dL
Nitrite: NEGATIVE
Specific Gravity, Urine: 1.01 (ref 1.005–1.030)
pH: 6 (ref 5.0–8.0)

## 2013-01-03 LAB — CBC
HCT: 34.7 % — ABNORMAL LOW (ref 36.0–46.0)
MCHC: 35.2 g/dL (ref 30.0–36.0)
MCV: 90.4 fL (ref 78.0–100.0)
Platelets: 257 10*3/uL (ref 150–400)
RDW: 12.7 % (ref 11.5–15.5)
WBC: 21 10*3/uL — ABNORMAL HIGH (ref 4.0–10.5)

## 2013-01-03 LAB — URINE MICROSCOPIC-ADD ON

## 2013-01-03 MED ORDER — EPHEDRINE 5 MG/ML INJ
10.0000 mg | INTRAVENOUS | Status: DC | PRN
  Filled 2013-01-03: qty 4

## 2013-01-03 MED ORDER — FENTANYL 2.5 MCG/ML BUPIVACAINE 1/10 % EPIDURAL INFUSION (WH - ANES)
14.0000 mL/h | INTRAMUSCULAR | Status: DC | PRN
  Administered 2013-01-04 (×2): 14 mL/h via EPIDURAL
  Filled 2013-01-03 (×2): qty 125

## 2013-01-03 MED ORDER — LACTATED RINGERS IV SOLN
INTRAVENOUS | Status: DC
  Administered 2013-01-03 – 2013-01-04 (×2): via INTRAVENOUS

## 2013-01-03 MED ORDER — OXYTOCIN BOLUS FROM INFUSION: 500.0000 mL | INTRAVENOUS | Status: DC

## 2013-01-03 MED ORDER — FLEET ENEMA 7-19 GM/118ML RE ENEM: 1.0000 | ENEMA | RECTAL | Status: DC | PRN

## 2013-01-03 MED ORDER — ONDANSETRON HCL 4 MG/2ML IJ SOLN
4.0000 mg | Freq: Four times a day (QID) | INTRAMUSCULAR | Status: DC | PRN
  Administered 2013-01-04: 4 mg via INTRAVENOUS
  Filled 2013-01-03: qty 2

## 2013-01-03 MED ORDER — IBUPROFEN 600 MG PO TABS: 600.0000 mg | ORAL_TABLET | Freq: Four times a day (QID) | ORAL | Status: DC | PRN

## 2013-01-03 MED ORDER — LACTATED RINGERS IV SOLN: 500.0000 mL | INTRAVENOUS | Status: DC | PRN

## 2013-01-03 MED ORDER — DIPHENHYDRAMINE HCL 50 MG/ML IJ SOLN: 12.5000 mg | INTRAMUSCULAR | Status: DC | PRN

## 2013-01-03 MED ORDER — CITRIC ACID-SODIUM CITRATE 334-500 MG/5ML PO SOLN: 30.0000 mL | ORAL | Status: DC | PRN

## 2013-01-03 MED ORDER — PHENYLEPHRINE 40 MCG/ML (10ML) SYRINGE FOR IV PUSH (FOR BLOOD PRESSURE SUPPORT)
80.0000 ug | PREFILLED_SYRINGE | INTRAVENOUS | Status: DC | PRN
  Filled 2013-01-03: qty 5

## 2013-01-03 MED ORDER — OXYTOCIN 40 UNITS IN LACTATED RINGERS INFUSION - SIMPLE MED
62.5000 mL/h | INTRAVENOUS | Status: DC
  Administered 2013-01-04: 999 mL/h via INTRAVENOUS
  Filled 2013-01-03: qty 1000

## 2013-01-03 MED ORDER — PHENYLEPHRINE 40 MCG/ML (10ML) SYRINGE FOR IV PUSH (FOR BLOOD PRESSURE SUPPORT): 80.0000 ug | PREFILLED_SYRINGE | INTRAVENOUS | Status: DC | PRN

## 2013-01-03 MED ORDER — LIDOCAINE HCL (PF) 1 % IJ SOLN
30.0000 mL | INTRAMUSCULAR | Status: DC | PRN
  Filled 2013-01-03: qty 30

## 2013-01-03 MED ORDER — EPHEDRINE 5 MG/ML INJ: 10.0000 mg | INTRAVENOUS | Status: DC | PRN

## 2013-01-03 MED ORDER — LACTATED RINGERS IV SOLN: 500.0000 mL | Freq: Once | INTRAVENOUS | Status: DC

## 2013-01-03 MED ORDER — ACETAMINOPHEN 325 MG PO TABS: 650.0000 mg | ORAL_TABLET | ORAL | Status: DC | PRN

## 2013-01-03 MED ORDER — OXYCODONE-ACETAMINOPHEN 5-325 MG PO TABS: 1.0000 | ORAL_TABLET | ORAL | Status: DC | PRN

## 2013-01-03 MED ORDER — FENTANYL CITRATE 0.05 MG/ML IJ SOLN
100.0000 ug | INTRAMUSCULAR | Status: DC | PRN
  Administered 2013-01-03: 100 ug via INTRAVENOUS
  Filled 2013-01-03: qty 2

## 2013-01-03 NOTE — Anesthesia Preprocedure Evaluation (Addendum)
Anesthesia Evaluation  Patient identified by MRN, date of birth, ID band Patient awake    Reviewed: Allergy & Precautions, H&P , NPO status , Patient's Chart, lab work & pertinent test results, reviewed documented beta blocker date and time   History of Anesthesia Complications Negative for: history of anesthetic complications  Airway Mallampati: I TM Distance: >3 FB Neck ROM: full    Dental  (+) Teeth Intact   Pulmonary Current Smoker,  breath sounds clear to auscultation        Cardiovascular negative cardio ROS  Rhythm:regular Rate:Normal     Neuro/Psych negative neurological ROS  negative psych ROS   GI/Hepatic negative GI ROS, Neg liver ROS,   Endo/Other  negative endocrine ROS  Renal/GU negative Renal ROS     Musculoskeletal   Abdominal   Peds  Hematology negative hematology ROS (+)   Anesthesia Other Findings   Reproductive/Obstetrics (+) Pregnancy                          Anesthesia Physical Anesthesia Plan  ASA: II  Anesthesia Plan: Epidural   Post-op Pain Management:    Induction:   Airway Management Planned:   Additional Equipment:   Intra-op Plan:   Post-operative Plan:   Informed Consent: I have reviewed the patients History and Physical, chart, labs and discussed the procedure including the risks, benefits and alternatives for the proposed anesthesia with the patient or authorized representative who has indicated his/her understanding and acceptance.     Plan Discussed with:   Anesthesia Plan Comments:         Anesthesia Quick Evaluation

## 2013-01-03 NOTE — MAU Note (Signed)
Contractions worsening, here this am and was 2.5 cm. Leaking clear fluid since 2015

## 2013-01-03 NOTE — MAU Provider Note (Signed)
Chief Complaint:  Abdominal Pain   First Provider Initiated Contact with Patient 01/03/13 1515      HPI: Megan Leon is a 22 y.o. G1P0 at [redacted]w[redacted]d who presents to maternity admissions reporting onset today of right-sided abdominal pain. She describes the pain as constant for about an hour prior to admission and it is now abated with resting. Pain has been continuous but waxing and waning and is exacerbated by movement.  Denies dysuria, hematuria, urgency. Does have frequency of urination. No food intolerance, fever, nausea vomiting. No previous episodes. Had slight spotting yesterday after cervix check at prenatal visit. Cervix was 1.5 cm. Denies contractions, leakage of fluid or vaginal bleeding. Good fetal movement.   Pregnancy Course: Care at [facility], essentially uncomplicated  Past Medical History: Past Medical History  Diagnosis Date  . No pertinent past medical history   . Medical history non-contributory   . Herpes     Past obstetric history: OB History  Gravida Para Term Preterm AB SAB TAB Ectopic Multiple Living  1             # Outcome Date GA Lbr Len/2nd Weight Sex Delivery Anes PTL Lv  1 CUR               Past Surgical History: Past Surgical History  Procedure Laterality Date  . No past surgeries       Family History: Family History  Problem Relation Age of Onset  . Hypertension Maternal Grandmother     Social History: History  Substance Use Topics  . Smoking status: Current Every Day Smoker -- 0.50 packs/day    Types: Cigarettes  . Smokeless tobacco: Never Used  . Alcohol Use: No    Allergies: No Known Allergies  Meds:  Prescriptions prior to admission  Medication Sig Dispense Refill  . acetaminophen (TYLENOL) 325 MG tablet Take 650 mg by mouth every 6 (six) hours as needed for pain (headache).      Marland Kitchen acyclovir (ZOVIRAX) 200 MG capsule Take 2 capsules (400 mg total) by mouth 3 (three) times daily.  180 capsule  6  . Prenatal Vitamins (DIS) TABS Take 1  tablet by mouth daily.        ROS: Pertinent findings in history of present illness.  Physical Exam  Blood pressure 115/77, pulse 118, temperature 98.4 F (36.9 C), temperature source Oral, resp. rate 18, height 5' (1.524 m), weight 63.107 kg (139 lb 2 oz), last menstrual period 04/01/2012, SpO2 97.00%. GENERAL: Well-developed, well-nourished female in no acute distress.  HEENT: normocephalic HEART: normal rate RESP: normal effort ABDOMEN: Soft, minimally tender along right side of uterus; neg Murphy's sign; negative TTP at costal margin;  gravid appropriate for gestational age Back: no CVAT EXTREMITIES: Nontender, no edema NEURO: alert and oriented SPECULUM EXAM: NEFG, physiologic discharge, no blood, cervix clean Dilation: 2 (2-2.5cm) Effacement (%): 60 Presentation: Vertex Exam by:: diedre Tobyn Osgood cnm  FHT:  Baseline 145-150 , moderate variability, accelerations present, no decelerations Contractions: occasional, mild   Labs: Results for orders placed during the hospital encounter of 01/03/13 (from the past 24 hour(s))  URINALYSIS, ROUTINE W REFLEX MICROSCOPIC     Status: Abnormal   Collection Time    01/03/13  2:38 PM      Result Value Range   Color, Urine YELLOW  YELLOW   APPearance CLEAR  CLEAR   Specific Gravity, Urine 1.010  1.005 - 1.030   pH 6.0  5.0 - 8.0   Glucose, UA 250 (*)  NEGATIVE mg/dL   Hgb urine dipstick TRACE (*) NEGATIVE   Bilirubin Urine NEGATIVE  NEGATIVE   Ketones, ur NEGATIVE  NEGATIVE mg/dL   Protein, ur NEGATIVE  NEGATIVE mg/dL   Urobilinogen, UA 0.2  0.0 - 1.0 mg/dL   Nitrite NEGATIVE  NEGATIVE   Leukocytes, UA TRACE (*) NEGATIVE  URINE MICROSCOPIC-ADD ON     Status: Abnormal   Collection Time    01/03/13  2:38 PM      Result Value Range   Squamous Epithelial / LPF FEW (*) RARE   WBC, UA 3-6  <3 WBC/hpf   RBC / HPF 0-2  <3 RBC/hpf   Bacteria, UA RARE  RARE    MAU Course: Changing position in bed elicits pain  Assessment: 1. Round  ligament pain   G1 at [redacted]w[redacted]d Category 1 FHR  Plan: Discharge home after UA resulted Labor precautions and fetal kick counts AVS on RLP   Medication List         acetaminophen 325 MG tablet  Commonly known as:  TYLENOL  Take 650 mg by mouth every 6 (six) hours as needed for pain (headache).     acyclovir 200 MG capsule  Commonly known as:  ZOVIRAX  Take 2 capsules (400 mg total) by mouth 3 (three) times daily.     Prenatal Vitamins (DIS) Tabs  Take 1 tablet by mouth daily.       Follow-up Information   Follow up with [facility] In 1 week.      Danae Orleans, CNM 01/03/2013 3:19 PM

## 2013-01-03 NOTE — MAU Note (Signed)
Pt was seen earlier in MAU.  States U/C's are coming closer together and more intense.  Pt up to void about 2000 and had clear fluid come from her vagina.  Denies vaginal bleeding.  Good fetal movement.

## 2013-01-03 NOTE — MAU Note (Signed)
Patient is in with c/o right mid abdominal pain that is constant for an hour, and pelvic pain. She denies vaginal bleeding or lof. She reports good fetal movement.

## 2013-01-04 ENCOUNTER — Inpatient Hospital Stay (HOSPITAL_COMMUNITY): Payer: Medicaid Other | Admitting: Anesthesiology

## 2013-01-04 ENCOUNTER — Encounter (HOSPITAL_COMMUNITY): Payer: Self-pay | Admitting: Family Medicine

## 2013-01-04 ENCOUNTER — Encounter (HOSPITAL_COMMUNITY): Payer: Self-pay | Admitting: Anesthesiology

## 2013-01-04 DIAGNOSIS — A6000 Herpesviral infection of urogenital system, unspecified: Secondary | ICD-10-CM

## 2013-01-04 DIAGNOSIS — O98519 Other viral diseases complicating pregnancy, unspecified trimester: Secondary | ICD-10-CM

## 2013-01-04 LAB — TYPE AND SCREEN
ABO/RH(D): O POS
Antibody Screen: NEGATIVE

## 2013-01-04 LAB — ABO/RH: ABO/RH(D): O POS

## 2013-01-04 MED ORDER — BENZOCAINE-MENTHOL 20-0.5 % EX AERO
1.0000 "application " | INHALATION_SPRAY | CUTANEOUS | Status: DC | PRN
  Administered 2013-01-04: 1 via TOPICAL
  Filled 2013-01-04 (×2): qty 56

## 2013-01-04 MED ORDER — ONDANSETRON HCL 4 MG/2ML IJ SOLN: 4.0000 mg | INTRAMUSCULAR | Status: DC | PRN

## 2013-01-04 MED ORDER — SIMETHICONE 80 MG PO CHEW: 80.0000 mg | CHEWABLE_TABLET | ORAL | Status: DC | PRN

## 2013-01-04 MED ORDER — TETANUS-DIPHTH-ACELL PERTUSSIS 5-2.5-18.5 LF-MCG/0.5 IM SUSP
0.5000 mL | Freq: Once | INTRAMUSCULAR | Status: DC
  Filled 2013-01-04: qty 0.5

## 2013-01-04 MED ORDER — ZOLPIDEM TARTRATE 5 MG PO TABS: 5.0000 mg | ORAL_TABLET | Freq: Every evening | ORAL | Status: DC | PRN

## 2013-01-04 MED ORDER — LANOLIN HYDROUS EX OINT: TOPICAL_OINTMENT | CUTANEOUS | Status: DC | PRN

## 2013-01-04 MED ORDER — ONDANSETRON HCL 4 MG PO TABS: 4.0000 mg | ORAL_TABLET | ORAL | Status: DC | PRN

## 2013-01-04 MED ORDER — SENNOSIDES-DOCUSATE SODIUM 8.6-50 MG PO TABS
2.0000 | ORAL_TABLET | Freq: Every day | ORAL | Status: DC
  Administered 2013-01-04: 2 via ORAL

## 2013-01-04 MED ORDER — PRENATAL MULTIVITAMIN CH
1.0000 | ORAL_TABLET | Freq: Every day | ORAL | Status: DC
  Administered 2013-01-04 – 2013-01-05 (×2): 1 via ORAL
  Filled 2013-01-04 (×2): qty 1

## 2013-01-04 MED ORDER — WITCH HAZEL-GLYCERIN EX PADS: 1.0000 "application " | MEDICATED_PAD | CUTANEOUS | Status: DC | PRN

## 2013-01-04 MED ORDER — DIBUCAINE 1 % RE OINT
1.0000 "application " | TOPICAL_OINTMENT | RECTAL | Status: DC | PRN
  Filled 2013-01-04: qty 28

## 2013-01-04 MED ORDER — OXYCODONE-ACETAMINOPHEN 5-325 MG PO TABS
1.0000 | ORAL_TABLET | ORAL | Status: DC | PRN
  Administered 2013-01-04 – 2013-01-05 (×4): 1 via ORAL
  Filled 2013-01-04 (×4): qty 1

## 2013-01-04 MED ORDER — LIDOCAINE HCL (PF) 1 % IJ SOLN
INTRAMUSCULAR | Status: DC | PRN
  Administered 2013-01-04 (×3): 4 mL

## 2013-01-04 MED ORDER — IBUPROFEN 600 MG PO TABS
600.0000 mg | ORAL_TABLET | Freq: Four times a day (QID) | ORAL | Status: DC
  Administered 2013-01-04 – 2013-01-05 (×5): 600 mg via ORAL
  Filled 2013-01-04 (×5): qty 1

## 2013-01-04 MED ORDER — DIPHENHYDRAMINE HCL 25 MG PO CAPS: 25.0000 mg | ORAL_CAPSULE | Freq: Four times a day (QID) | ORAL | Status: DC | PRN

## 2013-01-04 NOTE — MAU Provider Note (Signed)
Chart reviewed and agree with management and plan.  

## 2013-01-04 NOTE — Anesthesia Postprocedure Evaluation (Signed)
  Anesthesia Post-op Note  Patient: Megan Leon  Procedure(s) Performed: * No procedures listed *  Patient Location: Mother/Baby  Anesthesia Type:Epidural  Level of Consciousness: awake, alert  and oriented  Airway and Oxygen Therapy: Patient Spontanous Breathing  Post-op Pain: none  Post-op Assessment: Post-op Vital signs reviewed  Post-op Vital Signs: Reviewed and stable  Complications: No apparent anesthesia complications

## 2013-01-04 NOTE — H&P (Signed)
LIYAT FAULKENBERRY is a 22 y.o. female G1P0 with IUP at [redacted]w[redacted]d presenting for active labor. Pt states she has been having regular, every 2-3 minutes contractions since this AM, associated with no vaginal bleeding.  Membranes are ruptured, clear fluid at around 2015, with active fetal movement.     [facility] at [facility] since 1st trimester. Normal prenatal care thus far. Normal 1st trimester screen, normal [place], normal 2 hour.  Does have HSV and is taking her suppresion.   Prenatal History/Complications:  Past Medical History: Past Medical History  Diagnosis Date  . No pertinent past medical history   . Medical history non-contributory   . Herpes     Past Surgical History: Past Surgical History  Procedure Laterality Date  . No past surgeries      Obstetrical History: OB History   Grav Para Term Preterm Abortions TAB SAB Ect Mult Living   1              Social History: History   Social History  . Marital Status: Single    Spouse Name: N/A    Number of Children: N/A  . Years of Education: N/A   Social History Main Topics  . Smoking status: Current Every Day Smoker -- 0.50 packs/day    Types: Cigarettes  . Smokeless tobacco: Never Used  . Alcohol Use: No  . Drug Use: No  . Sexual Activity: Not Currently    Birth Control/ Protection: None   Other Topics Concern  . None   Social History Narrative  . None    Family History: Family History  Problem Relation Age of Onset  . Hypertension Maternal Grandmother     Allergies: No Known Allergies  Prescriptions prior to admission  Medication Sig Dispense Refill  . acetaminophen (TYLENOL) 325 MG tablet Take 650 mg by mouth every 6 (six) hours as needed for pain (headache).      Marland Kitchen acyclovir (ZOVIRAX) 200 MG capsule Take 2 capsules (400 mg total) by mouth 3 (three) times daily.  180 capsule  6  . Prenatal Vitamins (DIS) TABS Take 1 tablet by mouth daily.         Review of Systems   Constitutional: Negative for fever, chills,  weight loss, malaise/fatigue and diaphoresis.  HENT: Negative for hearing loss, ear pain, nosebleeds, congestion, sore throat, neck pain, tinnitus and ear discharge.   Eyes: Negative for blurred vision, double vision, photophobia, pain, discharge and redness.  Respiratory: Negative for cough, hemoptysis, sputum production, shortness of breath, wheezing and stridor.   Cardiovascular: Negative for chest pain, palpitations, orthopnea,  leg swelling  Gastrointestinal: Positive for abdominal pain. Negative for heartburn, nausea, vomiting, diarrhea, constipation, blood in stool Genitourinary: Negative for dysuria, urgency, frequency, hematuria and flank pain.  Musculoskeletal: Negative for myalgias, back pain, joint pain and falls.  Skin: Negative for itching and rash.  Neurological: Negative for dizziness, tingling, tremors, sensory change, speech change, focal weakness, seizures, loss of consciousness, weakness and headaches.  Endo/Heme/Allergies: Negative for environmental allergies and polydipsia. Does not bruise/bleed easily.  Psychiatric/Behavioral: Negative for depression, suicidal ideas, hallucinations, memory loss and substance abuse. The patient is not nervous/anxious and does not have insomnia.       Blood pressure 93/48, pulse 88, temperature 97.9 F (36.6 C), temperature source Oral, resp. rate 18, height 5' (1.524 m), weight 63.504 kg (140 lb), last menstrual period 04/01/2012, SpO2 99.00%. General appearance: alert, cooperative and mild distress Lungs: clear to auscultation bilaterally Heart: regular rate and rhythm Abdomen:  soft, non-tender; bowel sounds normal SSE: no active lesions. Normal exam.  Extremities: Homans sign is negative, no sign of DVT Presentation: cephalic Fetal monitoringBaseline: 140 bpm, Variability: Good {> 6 bpm), Accelerations: Reactive and Decelerations: Absent Uterine activity regular contractions  Dilation: 7 Effacement (%): 90 Station: 0 Exam by::  ansah-mensah, rnc   Prenatal labs: ABO, Rh: O/Positive/-- (01/15 0000) Antibody: NEG (06/12 0940) Rubella:   RPR: NON REAC (06/12 0940)  HBsAg: Negative (01/15 0000)  HIV: NON REACTIVE (06/12 0940)  GBS: NEGATIVE (08/18 1102)  2 hr Glucola 72/85/82 Genetic screening  Normal first trimester Anatomy US normal  .resultrcnt[24h  Assessment: JAYLEIGH NOTARIANNI is a 22 y.o. G1P0 with an IUP at [redacted]w[redacted]d presenting for active labor and SROM  Plan: 1) admit to L&D - routine orders - epidural prn  2) FWB - cat I tracing - GBS neg - HSV + but no active lesions on exam - efw 6.5lbs   3) anticipate SVD   Emaleigh Guimond L, MD 01/04/2013, 12:45 AM

## 2013-01-04 NOTE — Progress Notes (Signed)
Megan Leon is a 22 y.o. G1P0 at [redacted]w[redacted]d admitted for active labor, rupture of membranes  Subjective:  Comfortable s/p epidural. +FM  Objective: BP 98/63  Pulse 94  Temp(Src) 98 F (36.7 C) (Oral)  Resp 18  Ht 5' (1.524 m)  Wt 63.504 kg (140 lb)  BMI 27.34 kg/m2  SpO2 99%  LMP 04/01/2012      FHT:  FHR: 130 bpm, variability: moderate,  accelerations:  Present,  decelerations:  Present earlys UC:   regular, every 2-4 minutes SVE:   Dilation: 9 Effacement (%): 100 Station: 0 Exam by:: ansah-mensah, rnc  Labs: Lab Results  Component Value Date   WBC 21.0* 01/03/2013   HGB 12.2 01/03/2013   HCT 34.7* 01/03/2013   MCV 90.4 01/03/2013   PLT 257 01/03/2013    Assessment / Plan: Spontaneous labor, progressing normally  Labor: Progressing normally Fetal Wellbeing:  Category I Pain Control:  Epidural I/D:  n/a Anticipated MOD:  NSVD  Megan Leon 01/04/2013, 3:56 AM

## 2013-01-04 NOTE — Anesthesia Procedure Notes (Signed)
Epidural Patient location during procedure: OB Start time: 01/04/2013 12:03 AM  Staffing Performed by: anesthesiologist   Preanesthetic Checklist Completed: patient identified, site marked, surgical consent, pre-op evaluation, timeout performed, IV checked, risks and benefits discussed and monitors and equipment checked  Epidural Patient position: sitting Prep: site prepped and draped and DuraPrep Patient monitoring: continuous pulse ox and blood pressure Approach: midline Injection technique: LOR air  Needle:  Needle type: Tuohy  Needle gauge: 17 G Needle length: 9 cm and 9 Needle insertion depth: 4.5 cm Catheter type: closed end flexible Catheter size: 19 Gauge Catheter at skin depth: 9.5 cm Test dose: negative  Assessment Events: blood not aspirated, injection not painful, no injection resistance, negative IV test and no paresthesia  Additional Notes Discussed risk of headache, infection, bleeding, nerve injury and failed or incomplete block.  Patient voices understanding and wishes to proceed.  Epidural placed easily on first attempt.  No paresthesia.  Patient tolerated procedure well with no apparent complications.  Jasmine December, MDReason for block:procedure for pain

## 2013-01-04 NOTE — Progress Notes (Signed)
Pt extremely frustrated after 2 1/2 hours of pushing and is not pushing effectively.  Comfort measures taken.  Encouragement given.  Pt states "she is done".  Ice on perineum for perineal swelling.  Dr Ike Bene updated on pt progress.  Will come to assess pt for delivery assistance.

## 2013-01-05 MED ORDER — IBUPROFEN 600 MG PO TABS: 600.0000 mg | ORAL_TABLET | Freq: Four times a day (QID) | ORAL | Status: DC

## 2013-01-05 MED ORDER — OXYCODONE-ACETAMINOPHEN 5-325 MG PO TABS: 1.0000 | ORAL_TABLET | ORAL | Status: DC | PRN

## 2013-01-05 MED ORDER — MEASLES, MUMPS & RUBELLA VAC ~~LOC~~ INJ
0.5000 mL | INJECTION | Freq: Once | SUBCUTANEOUS | Status: AC
  Administered 2013-01-05: 0.5 mL via SUBCUTANEOUS
  Filled 2013-01-05: qty 0.5

## 2013-01-05 NOTE — Discharge Summary (Signed)
Obstetric Discharge Summary Reason for Admission: onset of labor and rupture of membranes and Round ligament pain Prenatal Procedures: none Intrapartum Procedures: spontaneous vaginal delivery and vacuum Postpartum Procedures: none Complications-Operative and Postpartum: none  Megan Leon is a 22yo G1P1001 who presented with Round Ligament pain, SROM, and active labor who progressed to have a vacuum vaginal delivery. She is PPD#1, is feeling well with adequate pain control, normal lochia, and sufficient activity. Denies N, V, CP, dizziness, and edema. She plans on using Depo as contraception, bottle feeding her newborn son, and having [facility] at [facility].  Hemoglobin  Date Value Range Status  01/03/2013 12.2  12.0 - 15.0 g/dL Final     HCT  Date Value Range Status  01/03/2013 34.7* 36.0 - 46.0 % Final    Physical Exam:  General: cooperative and no distress Lochia: appropriate Uterine Fundus: firm Incision: n/a  DVT Evaluation: No evidence of DVT seen on physical exam. Negative Homan's sign. No significant calf/ankle edema.  Discharge Diagnoses: Term Pregnancy-delivered  Discharge Information: Date: 01/05/2013 Activity: unrestricted Diet: routine Medications: Ibuprofen and Percocet Condition: stable Instructions: refer to practice specific booklet Discharge to: home Follow-up Information   Follow up with [facility] In 6 weeks.   Contact information:   [address] [name] [address] [phone]      Newborn Data: Live born female  Birth Weight: 7 lb 10.8 oz (3480 g) APGAR: 8, 9  Home with mother.  Megan Leon 01/05/2013, 8:59 AM

## 2013-01-05 NOTE — Discharge Summary (Signed)
Obstetric Discharge Summary Reason for Admission: onset of labor Prenatal Procedures: none Intrapartum Procedures: vacuum Postpartum Procedures: none Complications-Operative and Postpartum: second degree perineal laceration and right vulva laceration Hemoglobin  Date Value Range Status  01/03/2013 12.2  12.0 - 15.0 g/dL Final     HCT  Date Value Range Status  01/03/2013 34.7* 36.0 - 46.0 % Final    Physical Exam:  General: alert, cooperative and no distress Lochia: appropriate Uterine Fundus: firm Incision:  DVT Evaluation: No evidence of DVT seen on physical exam.  Discharge Diagnoses: Term Pregnancy-delivered  Discharge Information: Date: 01/05/2013 Activity: pelvic rest Diet: routine Medications: Ibuprofen and Percocet Condition: stable Instructions: vaginal delivery care after Discharge to: home Follow-up Information   Follow up with [facility] In 6 weeks.   Contact information:   [address] [name] [address] [phone]      Newborn Data: Live born female  Birth Weight: 7 lb 10.8 oz (3480 g) APGAR: 8, 9 Plans DMPA postpartum Home with mother.  Morayo Leven 01/05/2013, 8:00 AM

## 2013-01-08 ENCOUNTER — Ambulatory Visit: Payer: Medicaid Other | Admitting: Obstetrics & Gynecology

## 2013-01-09 ENCOUNTER — Encounter: Payer: Medicaid Other | Admitting: Women's Health

## 2013-01-22 DIAGNOSIS — Z029 Encounter for administrative examinations, unspecified: Secondary | ICD-10-CM

## 2013-02-06 ENCOUNTER — Encounter (HOSPITAL_COMMUNITY): Payer: Self-pay | Admitting: *Deleted

## 2013-02-21 ENCOUNTER — Ambulatory Visit: Payer: Medicaid Other | Admitting: Advanced Practice Midwife

## 2013-02-21 ENCOUNTER — Ambulatory Visit (INDEPENDENT_AMBULATORY_CARE_PROVIDER_SITE_OTHER): Payer: Medicaid Other | Admitting: Advanced Practice Midwife

## 2013-02-21 ENCOUNTER — Encounter (INDEPENDENT_AMBULATORY_CARE_PROVIDER_SITE_OTHER): Payer: Self-pay

## 2013-02-21 ENCOUNTER — Encounter: Payer: Self-pay | Admitting: Advanced Practice Midwife

## 2013-02-21 MED ORDER — LEVONORGESTREL 0.75 MG PO TABS: 0.7500 mg | ORAL_TABLET | Freq: Two times a day (BID) | ORAL | Status: DC

## 2013-02-21 MED ORDER — ULIPRISTAL ACETATE 30 MG PO TABS: 1.0000 | ORAL_TABLET | Freq: Once | ORAL | Status: DC

## 2013-02-21 MED ORDER — MEDROXYPROGESTERONE ACETATE 150 MG/ML IM SUSP: 150.0000 mg | INTRAMUSCULAR | Status: DC

## 2013-02-21 NOTE — Progress Notes (Signed)
  Megan Leon is a 22 y.o. who presents for a postpartum visit. She is 6 weeks postpartum following a vacuum assisted vaginal delivery. I have fully reviewed the prenatal and intrapartum course. The delivery was at 39.6 gestational weeks.  Anesthesia: epidural. Postpartum course has been uneventful. Baby's course has been uneventful. Baby is feeding by bottle. Bleeding: no bleeding. Bowel function is normal. Bladder function is normal. Patient is sexually active. Contraception method is none.  She had unprotected intercourse last night n day 11 of her cycle.  Recommended plan b.  Postpartum depression screening: negative.    Review of Systems   Constitutional: Negative for fever and chills Eyes: Negative for visual disturbances Respiratory: Negative for shortness of breath, dyspnea Cardiovascular: Negative for chest pain or palpitations  Gastrointestinal: Negative for vomiting, diarrhea and constipation Genitourinary: Negative for dysuria and urgency Musculoskeletal: Negative for back pain, joint pain, myalgias  Neurological: Negative for dizziness and headaches   Objective:     Filed Vitals:   02/21/13 1027  BP: 100/60   General:  alert, cooperative and no distress   Breasts:  negative  Lungs: clear to auscultation bilaterally  Heart:  regular rate and rhythm  Abdomen: Soft, nontender   Vulva:  normal  Vagina: normal vagina  Cervix:  closed  Corpus: Well involuted     Rectal Exam: no hemorrhoids        Assessment:    normal postpartum exam.  Plan:    1. Contraception: Samson Frederic rx sent to pharmacy.  Will give depo when she starts her next period 2. Follow up in:  on next menses for depo or as needed.

## 2013-02-21 NOTE — Addendum Note (Signed)
Addended by: Jacklyn Shell on: 02/21/2013 12:00 PM   Modules accepted: Orders

## 2013-02-27 ENCOUNTER — Ambulatory Visit (INDEPENDENT_AMBULATORY_CARE_PROVIDER_SITE_OTHER): Payer: Medicaid Other | Admitting: Obstetrics & Gynecology

## 2013-02-27 ENCOUNTER — Encounter: Payer: Self-pay | Admitting: Obstetrics & Gynecology

## 2013-02-27 VITALS — BP 110/68 | Ht 61.0 in | Wt 124.0 lb

## 2013-02-27 DIAGNOSIS — Z3049 Encounter for surveillance of other contraceptives: Secondary | ICD-10-CM

## 2013-02-27 DIAGNOSIS — Z3202 Encounter for pregnancy test, result negative: Secondary | ICD-10-CM

## 2013-02-27 LAB — POCT URINE PREGNANCY: Preg Test, Ur: NEGATIVE

## 2013-02-27 MED ORDER — MEDROXYPROGESTERONE ACETATE 150 MG/ML IM SUSP
150.0000 mg | Freq: Once | INTRAMUSCULAR | Status: AC
  Administered 2013-02-27: 150 mg via INTRAMUSCULAR

## 2013-02-27 NOTE — Progress Notes (Signed)
Pt here for Depo. No complaints at this time. To return in 12 weeks for next Depo. 

## 2013-05-22 ENCOUNTER — Encounter: Payer: Self-pay | Admitting: Adult Health

## 2013-05-22 ENCOUNTER — Encounter (INDEPENDENT_AMBULATORY_CARE_PROVIDER_SITE_OTHER): Payer: Self-pay

## 2013-05-22 ENCOUNTER — Ambulatory Visit (INDEPENDENT_AMBULATORY_CARE_PROVIDER_SITE_OTHER): Payer: Medicaid Other | Admitting: Adult Health

## 2013-05-22 VITALS — BP 110/60 | Ht 61.0 in | Wt 126.0 lb

## 2013-05-22 DIAGNOSIS — Z3200 Encounter for pregnancy test, result unknown: Secondary | ICD-10-CM

## 2013-05-22 DIAGNOSIS — Z309 Encounter for contraceptive management, unspecified: Secondary | ICD-10-CM

## 2013-05-22 DIAGNOSIS — Z3202 Encounter for pregnancy test, result negative: Secondary | ICD-10-CM

## 2013-05-22 DIAGNOSIS — Z3049 Encounter for surveillance of other contraceptives: Secondary | ICD-10-CM

## 2013-05-22 LAB — POCT URINE PREGNANCY: PREG TEST UR: NEGATIVE

## 2013-05-22 MED ORDER — MEDROXYPROGESTERONE ACETATE 150 MG/ML IM SUSP
150.0000 mg | Freq: Once | INTRAMUSCULAR | Status: AC
  Administered 2013-05-22: 150 mg via INTRAMUSCULAR

## 2013-08-14 ENCOUNTER — Ambulatory Visit: Payer: Medicaid Other

## 2013-08-14 ENCOUNTER — Telehealth: Payer: Self-pay | Admitting: Adult Health

## 2013-08-14 MED ORDER — MEDROXYPROGESTERONE ACETATE 150 MG/ML IM SUSP: 150.0000 mg | INTRAMUSCULAR | Status: DC

## 2013-08-14 NOTE — Telephone Encounter (Signed)
Pt informed Depo provera prescription e-scribed.

## 2013-08-16 ENCOUNTER — Ambulatory Visit: Payer: Medicaid Other

## 2013-08-21 ENCOUNTER — Ambulatory Visit (INDEPENDENT_AMBULATORY_CARE_PROVIDER_SITE_OTHER): Payer: Medicaid Other | Admitting: Women's Health

## 2013-08-21 ENCOUNTER — Encounter: Payer: Self-pay | Admitting: Women's Health

## 2013-08-21 ENCOUNTER — Other Ambulatory Visit (HOSPITAL_COMMUNITY)
Admission: RE | Admit: 2013-08-21 | Discharge: 2013-08-21 | Disposition: A | Discharge status: Home / Self Care | Payer: Medicaid Other | Source: Ambulatory Visit | Attending: Obstetrics & Gynecology | Admitting: Obstetrics & Gynecology

## 2013-08-21 VITALS — BP 110/80 | Ht 62.0 in | Wt 132.0 lb

## 2013-08-21 DIAGNOSIS — Z3049 Encounter for surveillance of other contraceptives: Secondary | ICD-10-CM

## 2013-08-21 DIAGNOSIS — Z01419 Encounter for gynecological examination (general) (routine) without abnormal findings: Secondary | ICD-10-CM | POA: Insufficient documentation

## 2013-08-21 DIAGNOSIS — Z309 Encounter for contraceptive management, unspecified: Secondary | ICD-10-CM

## 2013-08-21 DIAGNOSIS — F17200 Nicotine dependence, unspecified, uncomplicated: Secondary | ICD-10-CM | POA: Insufficient documentation

## 2013-08-21 DIAGNOSIS — Z113 Encounter for screening for infections with a predominantly sexual mode of transmission: Secondary | ICD-10-CM | POA: Insufficient documentation

## 2013-08-21 DIAGNOSIS — Z3202 Encounter for pregnancy test, result negative: Secondary | ICD-10-CM

## 2013-08-21 LAB — POCT URINE PREGNANCY: PREG TEST UR: NEGATIVE

## 2013-08-21 MED ORDER — ACYCLOVIR 400 MG PO TABS: 400.0000 mg | ORAL_TABLET | Freq: Two times a day (BID) | ORAL | Status: DC

## 2013-08-21 MED ORDER — MEDROXYPROGESTERONE ACETATE 150 MG/ML IM SUSP
150.0000 mg | Freq: Once | INTRAMUSCULAR | Status: AC
  Administered 2013-08-21: 150 mg via INTRAMUSCULAR

## 2013-08-21 NOTE — Patient Instructions (Signed)
Generalized Anxiety Disorder Generalized anxiety disorder (GAD) is a mental disorder. It interferes with life functions, including relationships, work, and school. GAD is different from normal anxiety, which everyone experiences at some point in their lives in response to specific life events and activities. Normal anxiety actually helps us prepare for and get through these life events and activities. Normal anxiety goes away after the event or activity is over.  GAD causes anxiety that is not necessarily related to specific events or activities. It also causes excess anxiety in proportion to specific events or activities. The anxiety associated with GAD is also difficult to control. GAD can vary from mild to severe. People with severe GAD can have intense waves of anxiety with physical symptoms (panic attacks).  SYMPTOMS The anxiety and worry associated with GAD are difficult to control. This anxiety and worry are related to many life events and activities and also occur more days than not for 6 months or longer. People with GAD also have three or more of the following symptoms (one or more in children):  Restlessness.   Fatigue.  Difficulty concentrating.   Irritability.  Muscle tension.  Difficulty sleeping or unsatisfying sleep. DIAGNOSIS GAD is diagnosed through an assessment by your caregiver. Your caregiver will ask you questions aboutyour mood,physical symptoms, and events in your life. Your caregiver may ask you about your medical history and use of alcohol or drugs, including prescription medications. Your caregiver may also do a physical exam and blood tests. Certain medical conditions and the use of certain substances can cause symptoms similar to those associated with GAD. Your caregiver may refer you to a mental health specialist for further evaluation. TREATMENT The following therapies are usually used to treat GAD:   Medication Antidepressant medication usually is  prescribed for long-term daily control. Antianxiety medications may be added in severe cases, especially when panic attacks occur.   Talk therapy (psychotherapy) Certain types of talk therapy can be helpful in treating GAD by providing support, education, and guidance. A form of talk therapy called cognitive behavioral therapy can teach you healthy ways to think about and react to daily life events and activities.  Stress managementtechniques These include yoga, meditation, and exercise and can be very helpful when they are practiced regularly. A mental health specialist can help determine which treatment is best for you. Some people see improvement with one therapy. However, other people require a combination of therapies. Document Released: 08/14/2012 Document Reviewed: 08/14/2012 [employer] [facility] Patient Information 2014 [employer] [place].  Medroxyprogesterone injection [Contraceptive] What is this medicine? MEDROXYPROGESTERONE (me DROX ee proe JES te rone) contraceptive injections prevent pregnancy. They provide effective birth control for 3 months. Depo-subQ Provera 104 is also used for treating pain related to endometriosis. This medicine may be used for other purposes; ask your health care provider or pharmacist if you have questions. COMMON BRAND NAME(S): Depo-Provera, Depo-subQ Provera 104 What should I tell my health care provider before I take this medicine? They need to know if you have any of these conditions: -frequently drink alcohol -asthma -blood vessel disease or a history of a blood clot in the lungs or legs -bone disease such as osteoporosis -breast cancer -diabetes -eating disorder (anorexia nervosa or bulimia) -high blood pressure -HIV infection or AIDS -kidney disease -liver disease -mental depression -migraine -seizures (convulsions) -stroke -tobacco smoker -vaginal bleeding -an unusual or allergic reaction to medroxyprogesterone, other hormones, medicines, foods,  dyes, or preservatives -pregnant or trying to get pregnant -breast-feeding How should I use this medicine?  Depo-Provera Contraceptive injection is given into a muscle. Depo-subQ Provera 104 injection is given under the skin. These injections are given by a health care professional. You must not be pregnant before getting an injection. The injection is usually given during the first 5 days after the start of a menstrual period or 6 weeks after delivery of a baby. Talk to your pediatrician regarding the use of this medicine in children. Special care may be needed. These injections have been used in female children who have started having menstrual periods. Overdosage: If you think you have taken too much of this medicine contact a poison control center or emergency room at once. NOTE: This medicine is only for you. Do not share this medicine with others. What if I miss a dose? Try not to miss a dose. You must get an injection once every 3 months to maintain birth control. If you cannot keep an appointment, call and reschedule it. If you wait longer than 13 weeks between Depo-Provera contraceptive injections or longer than 14 weeks between Depo-subQ Provera 104 injections, you could get pregnant. Use another method for birth control if you miss your appointment. You may also need a pregnancy test before receiving another injection. What may interact with this medicine? Do not take this medicine with any of the following medications: -bosentan This medicine may also interact with the following medications: -aminoglutethimide -antibiotics or medicines for infections, especially rifampin, rifabutin, rifapentine, and griseofulvin -aprepitant -barbiturate medicines such as phenobarbital or primidone -bexarotene -carbamazepine -medicines for seizures like ethotoin, felbamate, oxcarbazepine, phenytoin, topiramate -modafinil -St. John's wort This list may not describe all possible interactions. Give your  health care provider a list of all the medicines, herbs, non-prescription drugs, or dietary supplements you use. Also tell them if you smoke, drink alcohol, or use illegal drugs. Some items may interact with your medicine. What should I watch for while using this medicine? This drug does not protect you against HIV infection (AIDS) or other sexually transmitted diseases. Use of this product may cause you to lose calcium from your bones. Loss of calcium may cause weak bones (osteoporosis). Only use this product for more than 2 years if other forms of birth control are not right for you. The longer you use this product for birth control the more likely you will be at risk for weak bones. Ask your health care professional how you can keep strong bones. You may have a change in bleeding pattern or irregular periods. Many females stop having periods while taking this drug. If you have received your injections on time, your chance of being pregnant is very low. If you think you may be pregnant, see your health care professional as soon as possible. Tell your health care professional if you want to get pregnant within the next year. The effect of this medicine may last a long time after you get your last injection. What side effects may I notice from receiving this medicine? Side effects that you should report to your doctor or health care professional as soon as possible: -allergic reactions like skin rash, itching or hives, swelling of the face, lips, or tongue -breast tenderness or discharge -breathing problems -changes in vision -depression -feeling faint or lightheaded, falls -fever -pain in the abdomen, chest, groin, or leg -problems with balance, talking, walking -unusually weak or tired -yellowing of the eyes or skin Side effects that usually do not require medical attention (report to your doctor or health care professional if they continue or are  bothersome): -acne -fluid retention and  swelling -headache -irregular periods, spotting, or absent periods -temporary pain, itching, or skin reaction at site where injected -weight gain This list may not describe all possible side effects. Call your doctor for medical advice about side effects. You may report side effects to FDA at 1-800-FDA-1088. Where should I keep my medicine? This does not apply. The injection will be given to you by a health care professional. NOTE: This sheet is a summary. It may not cover all possible information. If you have questions about this medicine, talk to your doctor, pharmacist, or health care provider.  2014, Elsevier/Gold Standard. (2008-05-10 18:37:56)

## 2013-08-21 NOTE — Progress Notes (Signed)
Patient ID: Megan Leon, female   DOB: [birth year], 23 y.o.   MRN: [id number] Subjective:   Megan Leon is a 23 y.o. 551P1001 Caucasian female here for a routine well-woman FP Mcaid exam.  No LMP recorded. Patient has had an injection.  - Depo, has irregular spotting.  Current complaints: nerves- b/w her job, 7mth baby who is teething- her nerves seem to be worked up.  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  Gynecologic History No LMP recorded. Patient has had an injection. Contraception: Depo-Provera injections since shortly after birth of child 7mths ago Last Pap: never. Results were: n/a Last mammogram: never. Results were: never Last TCS: never  Obstetric History OB History  Gravida Para Term Preterm AB SAB TAB Ectopic Multiple Living  1 1 1       1     # Outcome Date GA Lbr Len/2nd Weight Sex Delivery Anes PTL Lv  1 TRM 01/04/13 [id number] 08:35 / 03:56 7 lb 10.8 oz (3.48 kg) M VAC EPI  Y      Review of Systems Patient denies any headaches, blurred vision, shortness of breath, chest pain, abdominal pain, problems with bowel movements, urination, or intercourse.  Objective:  BP 110/80  Ht 5\' 2"  (1.575 m)  Wt 132 lb (59.875 kg)  BMI 24.14 kg/m2  Breastfeeding? No Physical Exam  General:  Well developed, well nourished, no acute distress. She is alert and oriented x3. Skin:  Warm and dry Neck:  Midline trachea, no thyromegaly or nodules Cardiovascular: Regular rate and rhythm, no murmur  heard Lungs:  Effort normal, all lung fields clear to auscultation bilaterally Breasts:  No dominant palpable mass, retraction, or nipple discharge Abdomen:  Soft, non tender, no hepatosplenomegaly or masses, tanned- states she uses tanning bed Pelvic:  External genitalia is normal in appearance.  The vagina is normal in appearance. The cervix is bulbous, no CMT.  Thin prep pap is done w/ reflex HR HPV cotesting. Uterus is felt to be normal size, shape, and contour.  No adnexal masses or tenderness noted. Extremities:  No swelling or varicosities noted Psych:  She has a normal mood and affect  Assessment:   Healthy well-woman exam Mild anxiety 7mths pp SVD Contraception management H/O HSV2 on suppression therapy Smoker Uses tanning bed  Plan:  GC/CT from pap, HIV, RPR today, known HSV2 Stress relieving activities to relieve anxiety- walks, reading, baths, exercise, prayer Advised smoking cessation, discussed risks of smoking, declined QuitlineNC  Advised against using tanning beds to help prevent skin cancer Rx acyclovir 400mg  BID w/ 11RF for HSV2 suppression per request Depo today F/U 3mths for next depo, or sooner if needed Mammogram @21yo  or sooner if problems Colonoscopy @23yo  or sooner if problems  Marge DuncansKimberly Randall Peachie Barkalow CNM, [facility] EastWHNP-BC 08/21/2013 12:30 PM

## 2013-08-22 LAB — HIV ANTIBODY (ROUTINE TESTING W REFLEX): HIV 1&2 Ab, 4th Generation: NONREACTIVE

## 2013-08-22 LAB — RPR

## 2013-11-13 ENCOUNTER — Encounter: Payer: Self-pay | Admitting: Adult Health

## 2013-11-13 ENCOUNTER — Ambulatory Visit (INDEPENDENT_AMBULATORY_CARE_PROVIDER_SITE_OTHER): Payer: Medicaid Other | Admitting: Adult Health

## 2013-11-13 DIAGNOSIS — Z3202 Encounter for pregnancy test, result negative: Secondary | ICD-10-CM

## 2013-11-13 DIAGNOSIS — Z3049 Encounter for surveillance of other contraceptives: Secondary | ICD-10-CM

## 2013-11-13 DIAGNOSIS — Z3042 Encounter for surveillance of injectable contraceptive: Secondary | ICD-10-CM

## 2013-11-13 LAB — POCT URINE PREGNANCY: PREG TEST UR: NEGATIVE

## 2013-11-13 MED ORDER — MEDROXYPROGESTERONE ACETATE 150 MG/ML IM SUSP
150.0000 mg | Freq: Once | INTRAMUSCULAR | Status: AC
  Administered 2013-11-13: 150 mg via INTRAMUSCULAR

## 2014-02-05 ENCOUNTER — Ambulatory Visit (INDEPENDENT_AMBULATORY_CARE_PROVIDER_SITE_OTHER): Payer: Medicaid Other | Admitting: Adult Health

## 2014-02-05 ENCOUNTER — Encounter: Payer: Self-pay | Admitting: Adult Health

## 2014-02-05 DIAGNOSIS — Z30013 Encounter for initial prescription of injectable contraceptive: Secondary | ICD-10-CM

## 2014-02-05 DIAGNOSIS — Z3042 Encounter for surveillance of injectable contraceptive: Secondary | ICD-10-CM

## 2014-02-05 DIAGNOSIS — Z3202 Encounter for pregnancy test, result negative: Secondary | ICD-10-CM

## 2014-02-05 LAB — POCT URINE PREGNANCY: Preg Test, Ur: NEGATIVE

## 2014-02-05 MED ORDER — MEDROXYPROGESTERONE ACETATE 150 MG/ML IM SUSP
150.0000 mg | Freq: Once | INTRAMUSCULAR | Status: AC
Start: 2014-02-05 — End: 2014-02-05
  Administered 2014-02-05: 150 mg via INTRAMUSCULAR

## 2014-03-04 ENCOUNTER — Encounter: Payer: Self-pay | Admitting: Adult Health

## 2014-04-30 ENCOUNTER — Ambulatory Visit (INDEPENDENT_AMBULATORY_CARE_PROVIDER_SITE_OTHER): Payer: Medicaid Other | Admitting: Adult Health

## 2014-04-30 ENCOUNTER — Encounter: Payer: Self-pay | Admitting: Adult Health

## 2014-04-30 DIAGNOSIS — Z308 Encounter for other contraceptive management: Secondary | ICD-10-CM

## 2014-04-30 DIAGNOSIS — Z3042 Encounter for surveillance of injectable contraceptive: Secondary | ICD-10-CM

## 2014-04-30 DIAGNOSIS — Z3202 Encounter for pregnancy test, result negative: Secondary | ICD-10-CM

## 2014-04-30 LAB — POCT URINE PREGNANCY: Preg Test, Ur: NEGATIVE

## 2014-04-30 MED ORDER — MEDROXYPROGESTERONE ACETATE 150 MG/ML IM SUSP
150.0000 mg | Freq: Once | INTRAMUSCULAR | Status: AC
  Administered 2014-04-30: 150 mg via INTRAMUSCULAR

## 2014-07-23 ENCOUNTER — Encounter: Payer: Self-pay | Admitting: *Deleted

## 2014-07-23 ENCOUNTER — Ambulatory Visit (INDEPENDENT_AMBULATORY_CARE_PROVIDER_SITE_OTHER): Payer: 59 | Admitting: *Deleted

## 2014-07-23 DIAGNOSIS — Z3042 Encounter for surveillance of injectable contraceptive: Secondary | ICD-10-CM | POA: Diagnosis not present

## 2014-07-23 DIAGNOSIS — Z3202 Encounter for pregnancy test, result negative: Secondary | ICD-10-CM

## 2014-07-23 LAB — POCT URINE PREGNANCY: Preg Test, Ur: NEGATIVE

## 2014-07-23 MED ORDER — MEDROXYPROGESTERONE ACETATE 150 MG/ML IM SUSP
150.0000 mg | Freq: Once | INTRAMUSCULAR | Status: AC
  Administered 2014-07-23: 150 mg via INTRAMUSCULAR

## 2014-07-23 NOTE — Progress Notes (Signed)
Pt here for Depo. Reports no problems at this time. Return in 12 weeks for next shot. JSY 

## 2014-10-15 ENCOUNTER — Other Ambulatory Visit: Payer: Self-pay | Admitting: Adult Health

## 2014-10-15 ENCOUNTER — Ambulatory Visit: Payer: Medicaid Other

## 2014-10-18 ENCOUNTER — Ambulatory Visit (INDEPENDENT_AMBULATORY_CARE_PROVIDER_SITE_OTHER): Payer: 59 | Admitting: Obstetrics & Gynecology

## 2014-10-18 ENCOUNTER — Other Ambulatory Visit (HOSPITAL_COMMUNITY)
Admission: RE | Admit: 2014-10-18 | Discharge: 2014-10-18 | Disposition: A | Discharge status: Home / Self Care | Payer: Medicaid Other | Source: Ambulatory Visit | Attending: Obstetrics & Gynecology | Admitting: Obstetrics & Gynecology

## 2014-10-18 ENCOUNTER — Encounter: Payer: Self-pay | Admitting: Obstetrics & Gynecology

## 2014-10-18 VITALS — BP 110/70 | HR 80 | Ht 61.0 in | Wt 128.0 lb

## 2014-10-18 DIAGNOSIS — Z01419 Encounter for gynecological examination (general) (routine) without abnormal findings: Secondary | ICD-10-CM | POA: Insufficient documentation

## 2014-10-18 DIAGNOSIS — Z113 Encounter for screening for infections with a predominantly sexual mode of transmission: Secondary | ICD-10-CM | POA: Insufficient documentation

## 2014-10-18 MED ORDER — MEDROXYPROGESTERONE ACETATE 150 MG/ML IM SUSP: 150.0000 mg | INTRAMUSCULAR | Status: DC

## 2014-10-18 NOTE — Progress Notes (Signed)
Patient ID: Megan Leon, female   DOB: 07-11-1990, 24 y.o.   MRN: [id number] Subjective:     Megan Leon is a 24 y.o. female here for a routine exam.  No LMP recorded. Patient has had an injection. G1P1001 Birth Control Method:  Depo Menstrual Calendar(currently): amenorrheic  Current complaints: none.   Current acute medical issues:  none   Recent Gynecologic History No LMP recorded. Patient has had an injection. Last Pap: 2015,  normal Last mammogram: ,    Past Medical History  Diagnosis Date  . No pertinent past medical history   . Medical history non-contributory   . Herpes     Past Surgical History  Procedure Laterality Date  . No past surgeries      OB History    Gravida Para Term Preterm AB TAB SAB Ectopic Multiple Living   History   Social History  . Marital Status: Single    Spouse Name: N/A  . Number of Children: N/A  . Years of Education: N/A   Social History Main Topics  . Smoking status: Current Every Day Smoker -- 0.50 packs/day    Types: Cigarettes  . Smokeless tobacco: Never Used  . Alcohol Use: No  . Drug Use: No  . Sexual Activity: Yes    Birth Control/ Protection: Injection   Other Topics Concern  . None   Social History Narrative    Family History  Problem Relation Age of Onset  . Hypertension Maternal Grandmother      Current outpatient prescriptions:  .  medroxyPROGESTERone (DEPO-PROVERA) 150 MG/ML injection, INJECT 1 ML INTO MUSCLE EVERY 3 MONTHS, Disp: 1 mL, Rfl: 3  Review of Systems  Review of Systems  Constitutional: Negative for fever, chills, weight loss, malaise/fatigue and diaphoresis.  HENT: Negative for hearing loss, ear pain, nosebleeds, congestion, sore throat, neck pain, tinnitus and ear discharge.   Eyes: Negative for blurred vision, double vision, photophobia, pain, discharge and redness.  Respiratory: Negative for cough, hemoptysis, sputum production, shortness of breath, wheezing and  stridor.   Cardiovascular: Negative for chest pain, palpitations, orthopnea, claudication, leg swelling and PND.  Gastrointestinal: negative for abdominal pain. Negative for heartburn, nausea, vomiting, diarrhea, constipation, blood in stool and melena.  Genitourinary: Negative for dysuria, urgency, frequency, hematuria and flank pain.  Musculoskeletal: Negative for myalgias, back pain, joint pain and falls.  Skin: Negative for itching and rash.  Neurological: Negative for dizziness, tingling, tremors, sensory change, speech change, focal weakness, seizures, loss of consciousness, weakness and headaches.  Endo/Heme/Allergies: Negative for environmental allergies and polydipsia. Does not bruise/bleed easily.  Psychiatric/Behavioral: Negative for depression, suicidal ideas, hallucinations, memory loss and substance abuse. The patient is not nervous/anxious and does not have insomnia.        Objective:  Blood pressure 110/70, pulse 80, height  (1.549 m), weight 128 lb (58.06 kg), not currently breastfeeding.   Physical Exam  Vitals reviewed. Constitutional: She is oriented to person, place, and time. She appears well-developed and well-nourished.  HENT:  Head: Normocephalic and atraumatic.        Right Ear: External ear normal.  Left Ear: External ear normal.  Nose: Nose normal.  Mouth/Throat: Oropharynx is clear and moist.  Eyes: Conjunctivae and EOM are normal. Pupils are equal, round, and reactive to light. Right eye exhibits no discharge. Left eye exhibits no discharge. No scleral icterus.  Neck: Normal range of motion.  Neck supple. No tracheal deviation present. No thyromegaly present.  Cardiovascular: Normal rate, regular rhythm, normal heart sounds and intact distal pulses.  Exam reveals no gallop and no friction rub.   No murmur heard. Respiratory: Effort normal and breath sounds normal. No respiratory distress. She has no wheezes. She has no rales. She exhibits no tenderness.   GI: Soft. Bowel sounds are normal. She exhibits no distension and no mass. There is no tenderness. There is no rebound and no guarding.  Genitourinary:  Breasts no masses skin changes or nipple changes bilaterally      Vulva is normal without lesions Vagina is pink moist without discharge Cervix normal in appearance and pap is done Uterus is normal size shape and contour Adnexa is negative with normal sized ovaries   Musculoskeletal: Normal range of motion. She exhibits no edema and no tenderness.  Neurological: She is alert and oriented to person, place, and time. She has normal reflexes. She displays normal reflexes. No cranial nerve deficit. She exhibits normal muscle tone. Coordination normal.  Skin: Skin is warm and dry. No rash noted. No erythema. No pallor.  Psychiatric: She has a normal mood and affect. Her behavior is normal. Judgment and thought content normal.       Assessment:    Healthy female exam.    Plan:    keep depo appt

## 2014-10-21 ENCOUNTER — Ambulatory Visit: Payer: Medicaid Other

## 2014-10-21 LAB — CYTOLOGY - PAP

## 2014-10-22 ENCOUNTER — Encounter: Payer: Self-pay | Admitting: *Deleted

## 2014-10-22 ENCOUNTER — Ambulatory Visit (INDEPENDENT_AMBULATORY_CARE_PROVIDER_SITE_OTHER): Payer: 59 | Admitting: *Deleted

## 2014-10-22 DIAGNOSIS — Z3202 Encounter for pregnancy test, result negative: Secondary | ICD-10-CM

## 2014-10-22 DIAGNOSIS — Z3042 Encounter for surveillance of injectable contraceptive: Secondary | ICD-10-CM

## 2014-10-22 LAB — POCT URINE PREGNANCY: Preg Test, Ur: NEGATIVE

## 2014-10-22 MED ORDER — MEDROXYPROGESTERONE ACETATE 150 MG/ML IM SUSP
150.0000 mg | Freq: Once | INTRAMUSCULAR | Status: AC
  Administered 2014-10-22: 150 mg via INTRAMUSCULAR

## 2014-10-22 NOTE — Progress Notes (Signed)
Pt here for Depo. Reports no problems at this time. Return in 12 weeks for next shot. JSY 

## 2015-01-14 ENCOUNTER — Ambulatory Visit (INDEPENDENT_AMBULATORY_CARE_PROVIDER_SITE_OTHER): Payer: 59 | Admitting: *Deleted

## 2015-01-14 ENCOUNTER — Telehealth: Payer: Self-pay | Admitting: Obstetrics & Gynecology

## 2015-01-14 ENCOUNTER — Encounter: Payer: Self-pay | Admitting: *Deleted

## 2015-01-14 ENCOUNTER — Ambulatory Visit: Payer: 59

## 2015-01-14 ENCOUNTER — Telehealth: Payer: Self-pay | Admitting: *Deleted

## 2015-01-14 DIAGNOSIS — Z3042 Encounter for surveillance of injectable contraceptive: Secondary | ICD-10-CM | POA: Diagnosis not present

## 2015-01-14 DIAGNOSIS — Z3202 Encounter for pregnancy test, result negative: Secondary | ICD-10-CM | POA: Diagnosis not present

## 2015-01-14 LAB — POCT URINE PREGNANCY: Preg Test, Ur: NEGATIVE

## 2015-01-14 MED ORDER — ACYCLOVIR 400 MG PO TABS: 400.0000 mg | ORAL_TABLET | Freq: Three times a day (TID) | ORAL | Status: DC

## 2015-01-14 MED ORDER — MEDROXYPROGESTERONE ACETATE 150 MG/ML IM SUSP
150.0000 mg | Freq: Once | INTRAMUSCULAR | Status: AC
  Administered 2015-01-14: 150 mg via INTRAMUSCULAR

## 2015-01-14 NOTE — Progress Notes (Signed)
Pt here for Depo. Reports no problems at this time. Return in 12 weeks for next shot. JSY 

## 2015-01-14 NOTE — Telephone Encounter (Signed)
Pt came in for Depo and is in the middle of a herpes outbreak. It started yesterday. She is requesting Valtrex be sent to pharmacy. Thanks!! JSY

## 2015-01-17 NOTE — Telephone Encounter (Signed)
Dr. Despina Hidden ordered Acyclovir on 01/14/15. JSY

## 2015-04-08 ENCOUNTER — Ambulatory Visit: Payer: 59

## 2015-04-09 ENCOUNTER — Telehealth: Payer: Self-pay | Admitting: Obstetrics & Gynecology

## 2015-04-09 NOTE — Telephone Encounter (Signed)
Pt called stating that she would like refill of her depo shot sent to her [facility]. Please contact pt

## 2015-04-09 NOTE — Telephone Encounter (Signed)
Spoke with [facility], [place], pt does have refills of the depo provera ready for pick up.

## 2015-04-14 ENCOUNTER — Ambulatory Visit: Payer: 59

## 2015-04-15 ENCOUNTER — Ambulatory Visit (INDEPENDENT_AMBULATORY_CARE_PROVIDER_SITE_OTHER): Payer: Medicaid Other | Admitting: *Deleted

## 2015-04-15 ENCOUNTER — Encounter: Payer: Self-pay | Admitting: *Deleted

## 2015-04-15 DIAGNOSIS — Z3202 Encounter for pregnancy test, result negative: Secondary | ICD-10-CM

## 2015-04-15 DIAGNOSIS — Z3042 Encounter for surveillance of injectable contraceptive: Secondary | ICD-10-CM

## 2015-04-15 LAB — POCT URINE PREGNANCY: Preg Test, Ur: NEGATIVE

## 2015-04-15 MED ORDER — MEDROXYPROGESTERONE ACETATE 150 MG/ML IM SUSP
150.0000 mg | Freq: Once | INTRAMUSCULAR | Status: AC
  Administered 2015-04-15: 150 mg via INTRAMUSCULAR

## 2015-04-15 NOTE — Progress Notes (Signed)
Pt here for Depo. Reports no problems at this time. Return in 12 weeks for next shot. JSY 

## 2015-07-08 ENCOUNTER — Ambulatory Visit: Payer: 59

## 2015-07-14 ENCOUNTER — Ambulatory Visit (INDEPENDENT_AMBULATORY_CARE_PROVIDER_SITE_OTHER): Payer: Medicaid Other | Admitting: *Deleted

## 2015-07-14 ENCOUNTER — Encounter: Payer: Self-pay | Admitting: *Deleted

## 2015-07-14 DIAGNOSIS — Z3042 Encounter for surveillance of injectable contraceptive: Secondary | ICD-10-CM

## 2015-07-14 DIAGNOSIS — Z309 Encounter for contraceptive management, unspecified: Secondary | ICD-10-CM

## 2015-07-14 DIAGNOSIS — Z3202 Encounter for pregnancy test, result negative: Secondary | ICD-10-CM

## 2015-07-14 LAB — POCT URINE PREGNANCY: Preg Test, Ur: NEGATIVE

## 2015-07-14 MED ORDER — MEDROXYPROGESTERONE ACETATE 150 MG/ML IM SUSP
150.0000 mg | Freq: Once | INTRAMUSCULAR | Status: AC
  Administered 2015-07-14: 150 mg via INTRAMUSCULAR

## 2015-07-14 NOTE — Progress Notes (Signed)
Pt here for Depo. Pt tolerated shot well. Return in 12 weeks for next shot. JSY 

## 2015-10-06 ENCOUNTER — Encounter: Payer: Self-pay | Admitting: *Deleted

## 2015-10-06 ENCOUNTER — Other Ambulatory Visit: Payer: Self-pay | Admitting: Adult Health

## 2015-10-06 ENCOUNTER — Ambulatory Visit (INDEPENDENT_AMBULATORY_CARE_PROVIDER_SITE_OTHER): Payer: Medicaid Other | Admitting: *Deleted

## 2015-10-06 DIAGNOSIS — Z3202 Encounter for pregnancy test, result negative: Secondary | ICD-10-CM

## 2015-10-06 DIAGNOSIS — Z3049 Encounter for surveillance of other contraceptives: Secondary | ICD-10-CM | POA: Diagnosis not present

## 2015-10-06 DIAGNOSIS — Z3042 Encounter for surveillance of injectable contraceptive: Secondary | ICD-10-CM

## 2015-10-06 LAB — POCT URINE PREGNANCY: Preg Test, Ur: NEGATIVE

## 2015-10-06 MED ORDER — MEDROXYPROGESTERONE ACETATE 150 MG/ML IM SUSP
150.0000 mg | Freq: Once | INTRAMUSCULAR | Status: AC
  Administered 2015-10-06: 150 mg via INTRAMUSCULAR

## 2015-10-06 NOTE — Progress Notes (Signed)
Pt here for Depo. Pt tolerated shot well. Return in 12 weeks for next shot. JSY 

## 2015-11-10 ENCOUNTER — Other Ambulatory Visit: Payer: Medicaid Other | Admitting: Adult Health

## 2015-11-10 ENCOUNTER — Encounter: Payer: Self-pay | Admitting: Adult Health

## 2015-12-19 ENCOUNTER — Encounter: Payer: Self-pay | Admitting: Adult Health

## 2015-12-19 ENCOUNTER — Ambulatory Visit (INDEPENDENT_AMBULATORY_CARE_PROVIDER_SITE_OTHER): Payer: Medicaid Other | Admitting: Adult Health

## 2015-12-19 VITALS — BP 116/60 | HR 82 | Ht 60.5 in | Wt 137.5 lb

## 2015-12-19 DIAGNOSIS — Z308 Encounter for other contraceptive management: Secondary | ICD-10-CM | POA: Diagnosis not present

## 2015-12-19 DIAGNOSIS — Z3042 Encounter for surveillance of injectable contraceptive: Secondary | ICD-10-CM

## 2015-12-19 DIAGNOSIS — Z3009 Encounter for other general counseling and advice on contraception: Secondary | ICD-10-CM

## 2015-12-19 DIAGNOSIS — Z01419 Encounter for gynecological examination (general) (routine) without abnormal findings: Secondary | ICD-10-CM

## 2015-12-19 MED ORDER — MEDROXYPROGESTERONE ACETATE 150 MG/ML IM SUSP: 150.0000 mg | INTRAMUSCULAR | 4 refills | Status: DC

## 2015-12-19 NOTE — Patient Instructions (Signed)
Physical in  1 year Depo as scheduled  

## 2015-12-19 NOTE — Progress Notes (Signed)
Patient ID: Leonette Monarchshley N Grubb, female   DOB: 03-09-1991, 25 y.o.   MRN: [id number] History of Present Illness: Morrie Sheldonshley is a 25 year old white female, married, G1P1 in for a well woman gyn exam,she had a normal pap 10/18/14 and has family planning medicaid and is on Depo.   Current Medications, Allergies, Past Medical History, Past Surgical History, Family History and Social History were reviewed in [facility] electronic medical record.     Review of Systems: Patient denies any headaches, hearing loss, fatigue, blurred vision, shortness of breath, chest pain, abdominal pain, problems with bowel movements, urination, or intercourse. No joint pain or mood swings.    Physical Exam:BP 116/60 (BP Location: Left Arm, Patient Position: Sitting, Cuff Size: Normal)   Pulse 82   Ht 5' 0.5" (1.537 m)   Wt 137 lb 8 oz (62.4 kg)   BMI 26.41 kg/m  General:  Well developed, well nourished, no acute distress Skin:  Warm and dry Neck:  Midline trachea, normal thyroid, good ROM, no lymphadenopathy Lungs; Clear to auscultation bilaterally Breast:  No dominant palpable mass, retraction, or nipple discharge Cardiovascular: Regular rate and rhythm Abdomen:  Soft, non tender, no hepatosplenomegaly Pelvic:  External genitalia is normal in appearance, no lesions.  The vagina is normal in appearance. Urethra has no lesions or masses. The cervix is bulbous. GC/CHL performed. Uterus is felt to be normal size, shape, and contour.  No adnexal masses or tenderness noted.Bladder is non tender, no masses felt. Extremities/musculoskeletal:  No swelling or varicosities noted, no clubbing or cyanosis Psych:  No mood changes, alert and cooperative,seems happy   Impression: Well woman exam with routine gynecological exam  Family planning - Plan: GC/Chlamydia Probe Amp, HIV antibody, RPR  Encounter for surveillance of injectable contraceptive     Plan: Refilled depo provera 150 mg #1 with 4 refills GC/CHL  sent Check HIV and RPR Physical in 1 year, pap in 2019

## 2015-12-20 LAB — HIV ANTIBODY (ROUTINE TESTING W REFLEX): HIV SCREEN 4TH GENERATION: NONREACTIVE

## 2015-12-20 LAB — RPR: RPR: NONREACTIVE

## 2015-12-22 LAB — GC/CHLAMYDIA PROBE AMP
Chlamydia trachomatis, NAA: NEGATIVE
Neisseria gonorrhoeae by PCR: NEGATIVE

## 2015-12-29 ENCOUNTER — Ambulatory Visit: Payer: Medicaid Other

## 2015-12-30 ENCOUNTER — Encounter: Payer: Self-pay | Admitting: *Deleted

## 2015-12-30 ENCOUNTER — Ambulatory Visit (INDEPENDENT_AMBULATORY_CARE_PROVIDER_SITE_OTHER): Payer: Medicaid Other | Admitting: *Deleted

## 2015-12-30 DIAGNOSIS — Z308 Encounter for other contraceptive management: Secondary | ICD-10-CM

## 2015-12-30 DIAGNOSIS — Z3042 Encounter for surveillance of injectable contraceptive: Secondary | ICD-10-CM

## 2015-12-30 DIAGNOSIS — Z3202 Encounter for pregnancy test, result negative: Secondary | ICD-10-CM

## 2015-12-30 LAB — POCT URINE PREGNANCY: PREG TEST UR: NEGATIVE

## 2015-12-30 MED ORDER — MEDROXYPROGESTERONE ACETATE 150 MG/ML IM SUSP
150.0000 mg | Freq: Once | INTRAMUSCULAR | Status: AC
  Administered 2015-12-30: 150 mg via INTRAMUSCULAR

## 2015-12-30 NOTE — Progress Notes (Signed)
Pt here for Depo. Pt tolerated shot well. Return in 12 weeks for next shot. JSY 

## 2016-03-23 ENCOUNTER — Ambulatory Visit (INDEPENDENT_AMBULATORY_CARE_PROVIDER_SITE_OTHER): Payer: Medicaid Other | Admitting: *Deleted

## 2016-03-23 ENCOUNTER — Encounter: Payer: Self-pay | Admitting: *Deleted

## 2016-03-23 DIAGNOSIS — Z308 Encounter for other contraceptive management: Secondary | ICD-10-CM

## 2016-03-23 DIAGNOSIS — Z3202 Encounter for pregnancy test, result negative: Secondary | ICD-10-CM

## 2016-03-23 LAB — POCT URINE PREGNANCY: Preg Test, Ur: NEGATIVE

## 2016-03-23 MED ORDER — MEDROXYPROGESTERONE ACETATE 150 MG/ML IM SUSP
150.0000 mg | Freq: Once | INTRAMUSCULAR | Status: AC
  Administered 2016-03-23: 150 mg via INTRAMUSCULAR

## 2016-03-23 NOTE — Progress Notes (Signed)
Pt here for Depo. Pt tolerated shot well. Return in 12 weeks for next shot. JSY 

## 2016-06-15 ENCOUNTER — Ambulatory Visit: Payer: Medicaid Other

## 2016-06-17 ENCOUNTER — Ambulatory Visit: Payer: Medicaid Other

## 2016-06-18 ENCOUNTER — Ambulatory Visit (INDEPENDENT_AMBULATORY_CARE_PROVIDER_SITE_OTHER): Payer: Medicaid Other

## 2016-06-18 VITALS — Wt 140.0 lb

## 2016-06-18 DIAGNOSIS — Z3042 Encounter for surveillance of injectable contraceptive: Secondary | ICD-10-CM

## 2016-06-18 MED ORDER — MEDROXYPROGESTERONE ACETATE 150 MG/ML IM SUSP
150.0000 mg | Freq: Once | INTRAMUSCULAR | Status: AC
  Administered 2016-06-18: 150 mg via INTRAMUSCULAR

## 2016-06-18 NOTE — Progress Notes (Signed)
Pt here for Depo Shot 150 mg given  IM Lt Deltoid. Tolerated well. Will return 12 weeks for next.P Janean Eischen CMA

## 2016-09-10 ENCOUNTER — Ambulatory Visit (INDEPENDENT_AMBULATORY_CARE_PROVIDER_SITE_OTHER): Payer: Medicaid Other

## 2016-09-10 ENCOUNTER — Other Ambulatory Visit: Payer: Self-pay | Admitting: Women's Health

## 2016-09-10 DIAGNOSIS — Z308 Encounter for other contraceptive management: Secondary | ICD-10-CM | POA: Diagnosis not present

## 2016-09-10 DIAGNOSIS — Z3042 Encounter for surveillance of injectable contraceptive: Secondary | ICD-10-CM

## 2016-09-10 DIAGNOSIS — Z3202 Encounter for pregnancy test, result negative: Secondary | ICD-10-CM

## 2016-09-10 LAB — POCT URINE PREGNANCY: Preg Test, Ur: NEGATIVE

## 2016-09-10 MED ORDER — MEDROXYPROGESTERONE ACETATE 150 MG/ML IM SUSP
150.0000 mg | Freq: Once | INTRAMUSCULAR | Status: AC
  Administered 2016-09-10: 150 mg via INTRAMUSCULAR

## 2016-09-10 MED ORDER — ACYCLOVIR 400 MG PO TABS: 400.0000 mg | ORAL_TABLET | Freq: Three times a day (TID) | ORAL | 1 refills | Status: DC

## 2016-09-10 NOTE — Progress Notes (Signed)
Pt here for depo, reported to nurse she is having HSV outbreak, requests refill on acyclovir. Rx acyclovir 400mg  TID x 5d w/ 1RF.  Cheral MarkerKimberly R. Beckam Abdulaziz, CNM, [facility]NP-BC 09/10/2016 1:27 PM

## 2016-09-10 NOTE — Progress Notes (Signed)
PT here for depo shot 150 mg IM given RT Deltoid. Tolerated well. Return 12 weeks for next shot. Pad CMA 

## 2016-12-03 ENCOUNTER — Ambulatory Visit: Payer: Medicaid Other

## 2017-02-01 ENCOUNTER — Encounter (INDEPENDENT_AMBULATORY_CARE_PROVIDER_SITE_OTHER): Payer: Self-pay

## 2017-02-01 ENCOUNTER — Encounter: Payer: Self-pay | Admitting: Adult Health

## 2017-02-01 ENCOUNTER — Other Ambulatory Visit (HOSPITAL_COMMUNITY)
Admission: RE | Admit: 2017-02-01 | Discharge: 2017-02-01 | Disposition: A | Discharge status: Home / Self Care | Payer: Self-pay | Source: Ambulatory Visit | Attending: Adult Health | Admitting: Adult Health

## 2017-02-01 ENCOUNTER — Ambulatory Visit (INDEPENDENT_AMBULATORY_CARE_PROVIDER_SITE_OTHER): Payer: BLUE CROSS/BLUE SHIELD | Admitting: Adult Health

## 2017-02-01 ENCOUNTER — Encounter: Payer: Self-pay | Admitting: *Deleted

## 2017-02-01 VITALS — BP 110/60 | HR 78 | Ht 61.4 in | Wt 142.0 lb

## 2017-02-01 DIAGNOSIS — F329 Major depressive disorder, single episode, unspecified: Secondary | ICD-10-CM | POA: Insufficient documentation

## 2017-02-01 DIAGNOSIS — Z01419 Encounter for gynecological examination (general) (routine) without abnormal findings: Secondary | ICD-10-CM | POA: Diagnosis not present

## 2017-02-01 DIAGNOSIS — Z124 Encounter for screening for malignant neoplasm of cervix: Secondary | ICD-10-CM

## 2017-02-01 DIAGNOSIS — Z309 Encounter for contraceptive management, unspecified: Secondary | ICD-10-CM | POA: Diagnosis not present

## 2017-02-01 DIAGNOSIS — F32A Depression, unspecified: Secondary | ICD-10-CM | POA: Insufficient documentation

## 2017-02-01 MED ORDER — ESCITALOPRAM OXALATE 10 MG PO TABS: 10.0000 mg | ORAL_TABLET | Freq: Every day | ORAL | 6 refills | Status: DC

## 2017-02-01 MED ORDER — NORETHINDRONE ACET-ETHINYL EST 1-20 MG-MCG PO TABS: 1.0000 | ORAL_TABLET | Freq: Every day | ORAL | 11 refills | Status: DC

## 2017-02-01 MED ORDER — ACYCLOVIR 400 MG PO TABS: 400.0000 mg | ORAL_TABLET | Freq: Three times a day (TID) | ORAL | 3 refills | Status: DC

## 2017-02-01 NOTE — Patient Instructions (Addendum)
Start OCs with next period Use condoms Physical in 1 year Pap in 3 if normal F/U in 6 weeks with me

## 2017-02-01 NOTE — Progress Notes (Signed)
Patient ID: Megan Leon, female   DOB: 1990-07-12, 26 y.o.   MRN: [id number] History of Present Illness: Megan Leon is a 26 year old white female, married, G1P1, in for well woman gyn exam and pap.Her husband had a stroke in July while on Moped and was hit by car was in [place] [address] for over a week, home now.She was on depo and missed her August shot, and now wants to try the pill.    Current Medications, Allergies, Past Medical History, Past Surgical History, Family History and Social History were reviewed in [facility] record.     Review of Systems: Patient denies any headaches, hearing loss, fatigue, blurred vision, shortness of breath, chest pain, abdominal pain, problems with bowel movements, urination, or intercourse. No joint pain or mood swings.Vaginal discharge at times, has history of herpes and requests refill on acyclovir.     Physical Exam:BP 110/60 (BP Location: Left Arm, Patient Position: Sitting, Cuff Size: Normal)   Pulse 78   Ht 5' 1.4" (1.56 m)   Wt 142 lb (64.4 kg)   LMP 01/23/2017   BMI 26.48 kg/m  General:  Well developed, well nourished, no acute distress Skin:  Warm and dry Neck:  Midline trachea, normal thyroid, good ROM, no lymphadenopathy Lungs; Clear to auscultation bilaterally Breast:  No dominant palpable mass, retraction, or nipple discharge Cardiovascular: Regular rate and rhythm Abdomen:  Soft, non tender, no hepatosplenomegaly Pelvic:  External genitalia is normal in appearance, no lesions.  The vagina is normal in appearance. Urethra has no lesions or masses. The cervix is bulbous. Pap with CG/CHL and reflex HPV performed. Uterus is felt to be normal size, shape, and contour.  No adnexal masses or tenderness noted.Bladder is non tender, no masses felt. Extremities/musculoskeletal:  No swelling or varicosities noted, no clubbing or cyanosis Psych:  No mood changes, alert and cooperative,seems happy PHQ 9 score 9, is depressed,  but denies being suicidal, and  is open to meds, declines counseling at this time. Will Rx lexapro and will try junel 1/20.   Impression: 1. Encounter for physical examination, contraception, and Papanicolaou smear of cervix   2. Depression, unspecified depression type       Plan: Meds ordered this encounter  Medications  . acyclovir (ZOVIRAX) 400 MG tablet    Sig: Take 1 tablet (400 mg total) by mouth 3 (three) times daily. X 5 days    Dispense:  15 tablet    Refill:  3    Order Specific Question:   Supervising Provider    Answer:   Despina Hidden, LUTHER H [[id number]]  . norethindrone-ethinyl estradiol (MICROGESTIN,JUNEL,LOESTRIN) 1-20 MG-MCG tablet    Sig: Take 1 tablet by mouth daily.    Dispense:  1 Package    Refill:  11    Order Specific Question:   Supervising Provider    Answer:   Despina Hidden, LUTHER H [[id number]]  . escitalopram (LEXAPRO) 10 MG tablet    Sig: Take 1 tablet (10 mg total) by mouth daily.    Dispense:  30 tablet    Refill:  6    Order Specific Question:   Supervising Provider    Answer:   Lazaro Arms [[id number]]  Take time for self Start OCs with next period Use condoms Physical in 1 year Pap in 3 if normal F/U in 6 weeks with me on lexapro

## 2017-02-01 NOTE — Addendum Note (Signed)
Addended by: Federico Flake A on: 02/01/2017 11:37 AM   Modules accepted: Orders

## 2017-02-03 LAB — CYTOLOGY - PAP
CHLAMYDIA, DNA PROBE: NEGATIVE
Diagnosis: NEGATIVE
NEISSERIA GONORRHEA: NEGATIVE

## 2017-03-15 ENCOUNTER — Ambulatory Visit: Payer: BLUE CROSS/BLUE SHIELD | Admitting: Adult Health

## 2017-03-22 ENCOUNTER — Ambulatory Visit: Payer: BLUE CROSS/BLUE SHIELD | Admitting: Adult Health

## 2017-08-17 ENCOUNTER — Ambulatory Visit: Payer: BLUE CROSS/BLUE SHIELD | Admitting: Women's Health

## 2017-10-22 DIAGNOSIS — M545 Low back pain: Secondary | ICD-10-CM | POA: Diagnosis not present

## 2017-11-09 ENCOUNTER — Encounter: Payer: Self-pay | Admitting: Family Medicine

## 2017-11-09 ENCOUNTER — Ambulatory Visit (INDEPENDENT_AMBULATORY_CARE_PROVIDER_SITE_OTHER): Payer: 59 | Admitting: Family Medicine

## 2017-11-09 VITALS — BP 126/80 | HR 103 | Temp 98.3°F | Ht 61.0 in | Wt 143.0 lb

## 2017-11-09 DIAGNOSIS — Z7689 Persons encountering health services in other specified circumstances: Secondary | ICD-10-CM | POA: Diagnosis not present

## 2017-11-09 DIAGNOSIS — Z13228 Encounter for screening for other metabolic disorders: Secondary | ICD-10-CM | POA: Diagnosis not present

## 2017-11-09 DIAGNOSIS — A6004 Herpesviral vulvovaginitis: Secondary | ICD-10-CM

## 2017-11-09 DIAGNOSIS — Z1329 Encounter for screening for other suspected endocrine disorder: Secondary | ICD-10-CM

## 2017-11-09 DIAGNOSIS — F17200 Nicotine dependence, unspecified, uncomplicated: Secondary | ICD-10-CM

## 2017-11-09 DIAGNOSIS — Z113 Encounter for screening for infections with a predominantly sexual mode of transmission: Secondary | ICD-10-CM | POA: Diagnosis not present

## 2017-11-09 DIAGNOSIS — Z30011 Encounter for initial prescription of contraceptive pills: Secondary | ICD-10-CM | POA: Diagnosis not present

## 2017-11-09 LAB — PREGNANCY, URINE: Preg Test, Ur: NEGATIVE

## 2017-11-09 MED ORDER — VALACYCLOVIR HCL 500 MG PO TABS: 500.0000 mg | ORAL_TABLET | Freq: Two times a day (BID) | ORAL | 0 refills | Status: AC

## 2017-11-09 MED ORDER — NORETHINDRONE ACET-ETHINYL EST 1-20 MG-MCG PO TABS: 1.0000 | ORAL_TABLET | Freq: Every day | ORAL | 3 refills | Status: DC

## 2017-11-09 MED ORDER — ESCITALOPRAM OXALATE 10 MG PO TABS: 10.0000 mg | ORAL_TABLET | Freq: Every day | ORAL | 0 refills | Status: DC

## 2017-11-09 NOTE — Patient Instructions (Signed)
I replaced the acyclovir with valacyclovir.  He will take 1 tablet twice a day for 3 days per each episode of outbreak.  If you are requiring a refill, this lets me know that you are having more than 5 outbreaks per year and at that point we can consider suppressive therapy.  You had labs performed today.  You will be contacted with the results of the labs once they are available, usually in the next 3 business days for routine lab work.      Genital Herpes Genital herpes is a common sexually transmitted infection (STI) that is caused by a virus. The virus spreads from person to person through sexual contact. Infection can cause itching, blisters, and sores around the genitals or rectum. Symptoms may last several days and then go away This is called an outbreak. However, the virus remains in your body, so you may have more outbreaks in the future. The time between outbreaks varies and can be months or years. Genital herpes affects men and women. It is particularly concerning for pregnant women because the virus can be passed to the baby during delivery and can cause serious problems. Genital herpes is also a concern for people who have a weak disease-fighting (immune) system. What are the causes? This condition is caused by the herpes simplex virus (HSV) type 1 or type 2. The virus may spread through:  Sexual contact with an infected person, including vaginal, anal, and oral sex.  Contact with fluid from a herpes sore.  The skin. This means that you can get herpes from an infected partner even if he or she does not have a visible sore or does not know that he or she is infected.  What increases the risk? You are more likely to develop this condition if:  You have sex with many partners.  You do not use latex condoms during sex.  What are the signs or symptoms? Most people do not have symptoms (asymptomatic) or have mild symptoms that may be mistaken for other skin problems. Symptoms may  include:  Small red bumps near the genitals, rectum, or mouth. These bumps turn into blisters and then turn into sores.  Flu-like symptoms, including: ? Fever. ? Body aches. ? Swollen lymph nodes. ? Headache.  Painful urination.  Pain and itching in the genital area or rectal area.  Vaginal discharge.  Tingling or shooting pain in the legs and buttocks.  Generally, symptoms are more severe and last longer during the first (primary) outbreak. Flu-like symptoms are also more common during the primary outbreak. How is this diagnosed? Genital herpes may be diagnosed based on:  A physical exam.  Your medical history.  Blood tests.  A test of a fluid sample (culture) from an open sore.  How is this treated? There is no cure for this condition, but treatment with antiviral medicines that are taken by mouth (orally) can do the following:  Speed up healing and relieve symptoms.  Help to reduce the spread of the virus to sexual partners.  Limit the chance of future outbreaks, or make future outbreaks shorter.  Lessen symptoms of future outbreaks.  Your health care provider may also recommend pain relief medicines, such as aspirin or ibuprofen. Follow these instructions at home: Sexual activity  Do not have sexual contact during active outbreaks.  Practice safe sex. Latex condoms and female condoms may help prevent the spread of the herpes virus. General instructions  Keep the affected areas dry and clean.  Take  over-the-counter and prescription medicines only as told by your health care provider.  Avoid rubbing or touching blisters and sores. If you do touch blisters or sores: ? Wash your hands thoroughly with soap and water. ? Do not touch your eyes afterward.  To help relieve pain or itching, you may take the following actions as directed by your health care provider: ? Apply a cold, wet cloth (cold compress) to affected areas 4-6 times a day. ? Apply a substance  that protects your skin and reduces bleeding (astringent). ? Apply a gel that helps relieve pain around sores (lidocaine gel). ? Take a warm, shallow bath that cleans the genital area (sitz bath).  Keep all follow-up visits as told by your health care provider. This is important. How is this prevented?  Use condoms. Although anyone can get genital herpes during sexual contact, even with the use of a condom, a condom can provide some protection.  Avoid having multiple sexual partners.  Talk with your sexual partner about any symptoms either of you may have. Also, talk with your partner about any history of STIs.  Get tested for STIs before you have sex. Ask your partner to do the same.  Do not have sexual contact if you have symptoms of genital herpes. Contact a health care provider if:  Your symptoms are not improving with medicine.  Your symptoms return.  You have new symptoms.  You have a fever.  You have abdominal pain.  You have redness, swelling, or pain in your eye.  You notice new sores on other parts of your body.  You are a woman and experience bleeding between menstrual periods.  You have had herpes and you become pregnant or plan to become pregnant. Summary  Genital herpes is a common sexually transmitted infection (STI) that is caused by the herpes simplex virus (HSV) type 1 or type 2.  These viruses are most often spread through sexual contact with an infected person.  You are more likely to develop this condition if you have sex with many partners or you have unprotected sex.  Most people do not have symptoms (asymptomatic) or have mild symptoms that may be mistaken for other skin problems. Symptoms occur as outbreaks that may happen months or years apart.  There is no cure for this condition, but treatment with oral antiviral medicines can reduce symptoms, reduce the chance of spreading the virus to a partner, prevent future outbreaks, or shorten future  outbreaks. This information is not intended to replace advice given to you by your health care provider. Make sure you discuss any questions you have with your health care provider. Document Released: 04/16/2000 Document Revised: 03/19/2016 Document Reviewed: 03/19/2016 Elsevier Interactive Patient Education  [employer].
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
## 2017-11-10 LAB — CMP14+EGFR
A/G RATIO: 1.9 (ref 1.2–2.2)
ALBUMIN: 4.5 g/dL (ref 3.5–5.5)
ALT: 16 IU/L (ref 0–32)
AST: 20 IU/L (ref 0–40)
Alkaline Phosphatase: 93 IU/L (ref 39–117)
BILIRUBIN TOTAL: 0.4 mg/dL (ref 0.0–1.2)
BUN / CREAT RATIO: 8 — AB (ref 9–23)
BUN: 6 mg/dL (ref 6–20)
CHLORIDE: 102 mmol/L (ref 96–106)
CO2: 21 mmol/L (ref 20–29)
Calcium: 9.6 mg/dL (ref 8.7–10.2)
Creatinine, Ser: 0.73 mg/dL (ref 0.57–1.00)
GFR calc Af Amer: 132 mL/min/{1.73_m2} (ref >59)
GFR calc non Af Amer: 115 mL/min/{1.73_m2} (ref >59)
GLOBULIN, TOTAL: 2.4 g/dL (ref 1.5–4.5)
Glucose: 88 mg/dL (ref 65–99)
POTASSIUM: 4.2 mmol/L (ref 3.5–5.2)
SODIUM: 139 mmol/L (ref 134–144)
Total Protein: 6.9 g/dL (ref 6.0–8.5)

## 2017-11-10 LAB — RPR: RPR Ser Ql: NONREACTIVE

## 2017-11-10 LAB — HIV ANTIBODY (ROUTINE TESTING W REFLEX): HIV Screen 4th Generation wRfx: NONREACTIVE

## 2017-11-10 LAB — TSH: TSH: 0.708 u[IU]/mL (ref 0.450–4.500)

## 2017-11-11 ENCOUNTER — Encounter: Payer: Self-pay | Admitting: Family Medicine

## 2017-11-14 ENCOUNTER — Encounter: Payer: Self-pay | Admitting: Family Medicine

## 2017-11-14 ENCOUNTER — Ambulatory Visit (INDEPENDENT_AMBULATORY_CARE_PROVIDER_SITE_OTHER): Payer: 59 | Admitting: Family Medicine

## 2017-11-14 VITALS — BP 120/76 | HR 102 | Temp 97.5°F | Ht 61.0 in | Wt 143.0 lb

## 2017-11-14 DIAGNOSIS — J329 Chronic sinusitis, unspecified: Secondary | ICD-10-CM | POA: Diagnosis not present

## 2017-11-14 DIAGNOSIS — J40 Bronchitis, not specified as acute or chronic: Secondary | ICD-10-CM

## 2017-11-14 MED ORDER — GUAIFENESIN-CODEINE 100-10 MG/5ML PO SOLN: 5.0000 mL | Freq: Four times a day (QID) | ORAL | 0 refills | Status: DC | PRN

## 2017-11-14 MED ORDER — AZITHROMYCIN 250 MG PO TABS: ORAL_TABLET | ORAL | 0 refills | Status: DC

## 2017-11-14 NOTE — Patient Instructions (Signed)

## 2017-11-14 NOTE — Progress Notes (Signed)
Subjective: CC: sinusitis PCP: Raliegh IpGottschalk, Averiana Clouatre M, DO ZOX:WRUEAVHPI:Megan Leon is a 27 y.o. female presenting to clinic today for:  1. Sinusitis Patient reports a 3-day history of worsening cough and congestion.  She reports associated rhinorrhea, wheeze and dyspnea on exertion.  Denies any hemoptysis, fevers.  No rash, no diarrhea.  She does report several episodes of posttussis emesis and rhinorrhea.  No known sick contacts.  She has been using Mucinex and an over-the-counter cough and cold medication with little improvement in symptoms.  She is an every day smoker.  No known COPD or asthma.  ROS: Per HPI  No Known Allergies Past Medical History:  Diagnosis Date  . Herpes   . Medical history non-contributory   . No pertinent past medical history     Current Outpatient Medications:  .  escitalopram (LEXAPRO) 10 MG tablet, Take 1 tablet (10 mg total) by mouth daily., Disp: 90 tablet, Rfl: 0 .  norethindrone-ethinyl estradiol (MICROGESTIN,JUNEL,LOESTRIN) 1-20 MG-MCG tablet, Take 1 tablet by mouth daily., Disp: 3 Package, Rfl: 3 Social History   Socioeconomic History  . Marital status: Single    Spouse name: Not on file  . Number of children: 1  . Years of education: Not on file  . Highest education level: Not on file  Occupational History  . Not on file  Social Needs  . Financial resource strain: Not on file  . Food insecurity:    Worry: Not on file    Inability: Not on file  . Transportation needs:    Medical: Not on file    Non-medical: Not on file  Tobacco Use  . Smoking status: Current Every Day Smoker    Packs/day: 0.50    Years: 5.00    Pack years: 2.50    Types: Cigarettes  . Smokeless tobacco: Never Used  Substance and Sexual Activity  . Alcohol use: No  . Drug use: No  . Sexual activity: Yes    Birth control/protection: Pill  Lifestyle  . Physical activity:    Days per week: Not on file    Minutes per session: Not on file  . Stress: Not on file    Relationships  . Social connections:    Talks on phone: Not on file    Gets together: Not on file    Attends religious service: Not on file    Active member of club or organization: Not on file    Attends meetings of clubs or organizations: Not on file    Relationship status: Not on file  . Intimate partner violence:    Fear of current or ex partner: Not on file    Emotionally abused: Not on file    Physically abused: Not on file    Forced sexual activity: Not on file  Other Topics Concern  . Not on file  Social History Narrative  . Not on file   Family History  Problem Relation Age of Onset  . Hypertension Maternal Grandmother   . Diabetes Father   . Hypertension Father   . Anxiety disorder Sister     Objective: Office vital signs reviewed. BP 120/76   Pulse (!) 102   Temp (!) 97.5 F (36.4 C) (Oral)   Ht 5\' 1"  (1.549 m)   Wt 143 lb (64.9 kg)   LMP 10/22/2017   SpO2 98%   BMI 27.02 kg/m   Physical Examination:  General: Awake, alert, well nourished, nontoxic but appears tired.No acute distress HEENT: +TTP to sinuses  Neck: No masses palpated. No lymphadenopathy    Ears: Tympanic membranes intact, normal light reflex, no erythema, no bulging    Eyes: PERRLA, extraocular membranes intact, sclera white    Nose: nasal turbinates moist, clear nasal discharge    Throat: moist mucus membranes, no erythema, no tonsillar exudate.  Airway is patent Cardio: regular rate and rhythm, S1S2 heard, no murmurs appreciated Pulm: clear to auscultation bilaterally, no wheezes, rhonchi or rales; normal work of breathing on room air  Assessment/ Plan: 27 y.o. female   1. Sinobronchitis Patient with normal work of breathing and normal oxygen saturation on room air.  With the exception of mildly elevated pulse, vital signs are within normal limits.  Given worsening of symptoms, will empirically treat with Z-Pak and Robitussin-AC.  Caution sedation.  Work note provided excusing for  the next 2 days.  Reasons for return and emergent evaluation emergency department discussed.  Patient voiced good understanding will follow-up as needed.  The Narcotic Database has been reviewed.  There were no red flags.     Meds ordered this encounter  Medications  . azithromycin (ZITHROMAX Z-PAK) 250 MG tablet    Sig: As directed    Dispense:  6 tablet    Refill:  0  . guaiFENesin-codeine 100-10 MG/5ML syrup    Sig: Take 5 mLs by mouth every 6 (six) hours as needed for cough.    Dispense:  100 mL    Refill:  0     Abisai Coble Hulen Skains, DO [facility] [phone]

## 2017-11-21 ENCOUNTER — Encounter: Payer: Self-pay | Admitting: Family Medicine

## 2017-11-21 ENCOUNTER — Other Ambulatory Visit: Payer: Self-pay | Admitting: Physician Assistant

## 2017-11-21 MED ORDER — PREDNISONE 10 MG (48) PO TBPK: ORAL_TABLET | ORAL | 0 refills | Status: DC

## 2017-11-21 MED ORDER — ALBUTEROL SULFATE HFA 108 (90 BASE) MCG/ACT IN AERS: 2.0000 | INHALATION_SPRAY | Freq: Four times a day (QID) | RESPIRATORY_TRACT | 0 refills | Status: DC | PRN

## 2017-11-21 MED ORDER — CEFDINIR 300 MG PO CAPS: 300.0000 mg | ORAL_CAPSULE | Freq: Two times a day (BID) | ORAL | 0 refills | Status: DC

## 2017-11-21 NOTE — Progress Notes (Signed)
sterapred 

## 2017-11-30 ENCOUNTER — Other Ambulatory Visit: Payer: Self-pay | Admitting: Family Medicine

## 2017-11-30 ENCOUNTER — Ambulatory Visit (INDEPENDENT_AMBULATORY_CARE_PROVIDER_SITE_OTHER): Payer: 59 | Admitting: *Deleted

## 2017-11-30 ENCOUNTER — Encounter: Payer: Self-pay | Admitting: Family Medicine

## 2017-11-30 DIAGNOSIS — Z3042 Encounter for surveillance of injectable contraceptive: Secondary | ICD-10-CM

## 2017-11-30 MED ORDER — MEDROXYPROGESTERONE ACETATE 150 MG/ML IM SUSP: 150.0000 mg | INTRAMUSCULAR | 2 refills | Status: DC

## 2017-11-30 MED ORDER — MEDROXYPROGESTERONE ACETATE 150 MG/ML IM SUSP
150.0000 mg | Freq: Once | INTRAMUSCULAR | Status: AC
  Administered 2017-11-30: 150 mg via INTRAMUSCULAR

## 2017-11-30 NOTE — Progress Notes (Signed)
Pt given Medroxyprogesterone inj Tolerated well 

## 2018-01-15 ENCOUNTER — Encounter: Payer: Self-pay | Admitting: Family Medicine

## 2018-01-16 ENCOUNTER — Other Ambulatory Visit: Payer: Self-pay | Admitting: Physician Assistant

## 2018-01-16 MED ORDER — VALACYCLOVIR HCL 1 G PO TABS: 1000.0000 mg | ORAL_TABLET | Freq: Two times a day (BID) | ORAL | 0 refills | Status: DC

## 2018-01-25 ENCOUNTER — Other Ambulatory Visit: Payer: Self-pay | Admitting: Family Medicine

## 2018-01-25 ENCOUNTER — Encounter: Payer: Self-pay | Admitting: Family Medicine

## 2018-01-25 MED ORDER — ESCITALOPRAM OXALATE 20 MG PO TABS: 20.0000 mg | ORAL_TABLET | Freq: Every day | ORAL | 1 refills | Status: DC

## 2018-02-17 ENCOUNTER — Ambulatory Visit: Payer: 59

## 2018-03-03 ENCOUNTER — Ambulatory Visit (INDEPENDENT_AMBULATORY_CARE_PROVIDER_SITE_OTHER): Payer: 59 | Admitting: *Deleted

## 2018-03-03 DIAGNOSIS — Z3042 Encounter for surveillance of injectable contraceptive: Secondary | ICD-10-CM | POA: Diagnosis not present

## 2018-03-03 LAB — PREGNANCY, URINE: PREG TEST UR: NEGATIVE

## 2018-03-03 MED ORDER — MEDROXYPROGESTERONE ACETATE 150 MG/ML IM SUSP
150.0000 mg | INTRAMUSCULAR | Status: DC
  Administered 2018-03-03 – 2018-06-02 (×2): 150 mg via INTRAMUSCULAR

## 2018-03-03 NOTE — Progress Notes (Signed)
Pt given Medroxyprogesterone inj Tolerated well 

## 2018-04-05 ENCOUNTER — Encounter: Payer: Self-pay | Admitting: Family Medicine

## 2018-04-06 ENCOUNTER — Other Ambulatory Visit: Payer: Self-pay | Admitting: Physician Assistant

## 2018-04-06 MED ORDER — AMOXICILLIN 500 MG PO CAPS: 500.0000 mg | ORAL_CAPSULE | Freq: Three times a day (TID) | ORAL | 0 refills | Status: DC

## 2018-04-06 NOTE — Addendum Note (Signed)
Addended by: Tamera PuntWRAY, WENDY S on: 04/06/2018 04:11 PM   Modules accepted: Orders

## 2018-04-10 ENCOUNTER — Encounter: Payer: Self-pay | Admitting: Family Medicine

## 2018-04-11 ENCOUNTER — Encounter: Payer: Self-pay | Admitting: Family Medicine

## 2018-04-11 ENCOUNTER — Ambulatory Visit (INDEPENDENT_AMBULATORY_CARE_PROVIDER_SITE_OTHER): Payer: 59 | Admitting: Family Medicine

## 2018-04-11 VITALS — BP 131/92 | HR 102 | Temp 98.9°F | Ht 61.0 in | Wt 141.0 lb

## 2018-04-11 DIAGNOSIS — R109 Unspecified abdominal pain: Secondary | ICD-10-CM | POA: Diagnosis not present

## 2018-04-11 DIAGNOSIS — M62830 Muscle spasm of back: Secondary | ICD-10-CM

## 2018-04-11 DIAGNOSIS — M545 Low back pain, unspecified: Secondary | ICD-10-CM

## 2018-04-11 LAB — URINALYSIS, COMPLETE
Bilirubin, UA: NEGATIVE
Glucose, UA: NEGATIVE
KETONES UA: NEGATIVE
Leukocytes, UA: NEGATIVE
NITRITE UA: NEGATIVE
Protein, UA: NEGATIVE
RBC, UA: NEGATIVE
SPEC GRAV UA: 1.02 (ref 1.005–1.030)
Urobilinogen, Ur: 2 mg/dL — ABNORMAL HIGH (ref 0.2–1.0)
pH, UA: 5.5 (ref 5.0–7.5)

## 2018-04-11 LAB — MICROSCOPIC EXAMINATION
BACTERIA UA: NONE SEEN
RBC MICROSCOPIC, UA: NONE SEEN /HPF (ref 0–2)
RENAL EPITHEL UA: NONE SEEN /HPF

## 2018-04-11 MED ORDER — NAPROXEN 500 MG PO TABS: 500.0000 mg | ORAL_TABLET | Freq: Two times a day (BID) | ORAL | 1 refills | Status: DC

## 2018-04-11 MED ORDER — CYCLOBENZAPRINE HCL 5 MG PO TABS: 5.0000 mg | ORAL_TABLET | Freq: Three times a day (TID) | ORAL | 0 refills | Status: AC | PRN

## 2018-04-11 NOTE — Progress Notes (Signed)
Subjective:    Patient ID: Megan Leon, female    DOB: 1991-01-22, 27 y.o.   MRN: [id number]  Chief Complaint:  Pain in lower back and flank area   HPI: Megan Leon is a 27 y.o. female presenting on 04/11/2018 for Pain in lower back and flank area  Pt presents today with complaints of right lower back and flank pain. This is a new problem. The current episode started 5 days ago. The problem occurs intermittently. The problem has been waxing and waning. The quality of the pain is described as dull to sharp and shooting. The pain is at a severity of 7/10. The pain is worse with bending and sitting for long periods of time. Pt denies fever, chills, dysuria, hematuria, weakness, numbness, tingling, loss of bowel or bladder, loss of function, or gait abnormalities. Denis injury. Pt has tried ibuprofen for the symptoms. The treatment provided some relief.    Relevant past medical, surgical, family, and social history reviewed and updated as indicated.  Allergies and medications reviewed and updated.   Past Medical History:  Diagnosis Date  . Herpes   . Medical history non-contributory   . No pertinent past medical history     Past Surgical History:  Procedure Laterality Date  . NO PAST SURGERIES      Social History   Socioeconomic History  . Marital status: Single    Spouse name: Not on file  . Number of children: 1  . Years of education: Not on file  . Highest education level: Not on file  Occupational History  . Not on file  Social Needs  . Financial resource strain: Not on file  . Food insecurity:    Worry: Not on file    Inability: Not on file  . Transportation needs:    Medical: Not on file    Non-medical: Not on file  Tobacco Use  . Smoking status: Current Every Day Smoker    Packs/day: 0.50    Years: 5.00    Pack years: 2.50    Types: Cigarettes  . Smokeless tobacco: Never Used  Substance and Sexual Activity  . Alcohol use: No  . Drug use: No  .  Sexual activity: Yes    Birth control/protection: Pill  Lifestyle  . Physical activity:    Days per week: Not on file    Minutes per session: Not on file  . Stress: Not on file  Relationships  . Social connections:    Talks on phone: Not on file    Gets together: Not on file    Attends religious service: Not on file    Active member of club or organization: Not on file    Attends meetings of clubs or organizations: Not on file    Relationship status: Not on file  . Intimate partner violence:    Fear of current or ex partner: Not on file    Emotionally abused: Not on file    Physically abused: Not on file    Forced sexual activity: Not on file  Other Topics Concern  . Not on file  Social History Narrative  . Not on file    Outpatient Encounter Medications as of 04/11/2018  Medication Sig  . albuterol (PROVENTIL HFA;VENTOLIN HFA) 108 (90 Base) MCG/ACT inhaler Inhale 2 puffs into the lungs every 6 (six) hours as needed for wheezing or shortness of breath.  Marland Kitchen amoxicillin (AMOXIL) 500 MG capsule Take 1 capsule (500 mg total) by  mouth 3 (three) times daily.  . cyclobenzaprine (FLEXERIL) 5 MG tablet Take 1 tablet (5 mg total) by mouth 3 (three) times daily as needed for up to 10 days for muscle spasms.  Marland Kitchen escitalopram (LEXAPRO) 20 MG tablet Take 1 tablet (20 mg total) by mouth daily.  . medroxyPROGESTERone (DEPO-PROVERA) 150 MG/ML injection Inject 1 mL (150 mg total) into the muscle every 3 (three) months.  . naproxen (NAPROSYN) 500 MG tablet Take 1 tablet (500 mg total) by mouth 2 (two) times daily with a meal.  . predniSONE (STERAPRED UNI-PAK 48 TAB) 10 MG (48) TBPK tablet Take 12 day pack as directed  . valACYclovir (VALTREX) 1000 MG tablet Take 1 tablet (1,000 mg total) by mouth 2 (two) times daily.   Facility-Administered Encounter Medications as of 04/11/2018  Medication  . medroxyPROGESTERone (DEPO-PROVERA) injection 150 mg    No Known Allergies  Review of Systems    Constitutional: Negative for activity change, chills, fatigue and fever.  Respiratory: Negative for cough and shortness of breath.   Cardiovascular: Negative for chest pain, palpitations and leg swelling.  Gastrointestinal: Negative for abdominal pain, constipation, diarrhea, nausea and vomiting.  Genitourinary: Positive for flank pain. Negative for difficulty urinating, dysuria, frequency, hematuria, urgency, vaginal discharge and vaginal pain.  Musculoskeletal: Positive for back pain and myalgias. Negative for neck pain and neck stiffness.  Skin: Negative for color change.  Neurological: Negative for dizziness, tremors, seizures, syncope, facial asymmetry, speech difficulty, weakness, light-headedness, numbness and headaches.  Psychiatric/Behavioral: Negative for confusion.  All other systems reviewed and are negative.       Objective:    BP (!) 131/92   Pulse (!) 102   Temp 98.9 F (37.2 C) (Oral)   Ht 5\' 1"  (1.549 m)   Wt 141 lb (64 kg)   BMI 26.64 kg/m    Wt Readings from Last 3 Encounters:  04/11/18 141 lb (64 kg)  11/14/17 143 lb (64.9 kg)  11/09/17 143 lb (64.9 kg)    Physical Exam  Constitutional: She appears well-developed and well-nourished. She is cooperative. She appears distressed (mild).  HENT:  Head: Normocephalic and atraumatic.  Neck: Normal range of motion and full passive range of motion without pain. Neck supple.  Cardiovascular: Normal rate, regular rhythm, normal heart sounds and normal pulses. Exam reveals no gallop and no friction rub.  No murmur heard. Pulmonary/Chest: Effort normal and breath sounds normal. No respiratory distress.  Abdominal: Soft. Bowel sounds are normal. She exhibits no distension. There is no tenderness.  Musculoskeletal:       Thoracic back: Normal.       Lumbar back: She exhibits decreased range of motion (lateral rotation due to pain), tenderness, pain and spasm. She exhibits no bony tenderness, no swelling, no edema, no  deformity, no laceration and normal pulse.       Back:  Neurological: She is alert. She has normal strength and normal reflexes. No cranial nerve deficit or sensory deficit.  Skin: Skin is warm and dry. Capillary refill takes less than 2 seconds.  Psychiatric: She has a normal mood and affect. Her speech is normal and behavior is normal. Judgment and thought content normal. Cognition and memory are normal.  Nursing note and vitals reviewed.   Results for orders placed or performed in visit on 03/03/18  Pregnancy, urine  Result Value Ref Range   Preg Test, Ur Negative Negative     Urinalysis negative.   Pertinent labs & imaging results that were available during  my care of the patient were reviewed by me and considered in my medical decision making.  Assessment & Plan:  Morrie Sheldonshley was seen today for pain in lower back and flank area.  Diagnoses and all orders for this visit:  Acute right-sided low back pain without sciatica Moist heat. Activity as tolerated. Back strengthening and stretching exercises. Biofreeze or Thermacare if beneficial. Medications as prescribed.  -     naproxen (NAPROSYN) 500 MG tablet; Take 1 tablet (500 mg total) by mouth 2 (two) times daily with a meal.  Spasm of lumbar paraspinous muscle Moist heat. Activity as tolerated. Back strengthening and stretching exercises. Biofreeze or Thermacare if beneficial. Medications as prescribed.  -     naproxen (NAPROSYN) 500 MG tablet; Take 1 tablet (500 mg total) by mouth 2 (two) times daily with a meal. -     cyclobenzaprine (FLEXERIL) 5 MG tablet; Take 1 tablet (5 mg total) by mouth 3 (three) times daily as needed for up to 10 days for muscle spasms.  Flank pain Urinalysis negative in office.  -     Urinalysis, Complete -     naproxen (NAPROSYN) 500 MG tablet; Take 1 tablet (500 mg total) by mouth 2 (two) times daily with a meal.      Continue all other maintenance medications.  Follow up plan: Return in about 4  weeks (around 05/09/2018), or if symptoms worsen or fail to improve.  Educational handout given for back pain, back exercises, muscle spasm   The above assessment and management plan was discussed with the patient. The patient verbalized understanding of and has agreed to the management plan. Patient is aware to call the clinic if symptoms persist or worsen. Patient is aware when to return to the clinic for a follow-up visit. Patient educated on when it is appropriate to go to the emergency department.   Kari BaarsMichelle Rakes, FNP-C [facility] [phone]

## 2018-04-11 NOTE — Patient Instructions (Signed)
Muscle Cramps and Spasms Muscle cramps and spasms are when muscles tighten by themselves. They usually get better within minutes. Muscle cramps are painful. They are usually stronger and last longer than muscle spasms. Muscle spasms may or may not be painful. They can last a few seconds or much longer. Follow these instructions at home:  Drink enough fluid to keep your pee (urine) clear or pale yellow.  Massage, stretch, and relax the muscle.  If directed, apply heat to tight or tense muscles as often as told by your doctor. Use the heat source that your doctor recommends. ? Place a towel between your skin and the heat source. ? Leave the heat on for 20-30 minutes. ? Take off the heat if your skin turns bright red. This is especially important if you are unable to feel pain, heat, or cold. You may have a greater risk of getting burned.  If directed, put ice on the affected area. This may help if you are sore or have pain after a cramp or spasm. ? Put ice in a plastic bag. ? Place a towel between your skin and the bag. ? Leave the ice on for 20 minutes, 2-3 times a day.  Take over-the-counter and prescription medicines only as told by your doctor.  Pay attention to any changes in your symptoms. Contact a doctor if:  Your cramps or spasms get worse or happen more often.  Your cramps or spasms do not get better with time. This information is not intended to replace advice given to you by your health care provider. Make sure you discuss any questions you have with your health care provider. Document Released: 04/01/2008 Document Revised: 05/21/2015 Document Reviewed: 01/21/2015 Elsevier Interactive Patient Education  2018 [employer] Back Exercises If you have pain in your back, do these exercises 2-3 times each day or as told by your doctor. When the pain goes away, do the exercises once each day, but repeat the steps more times for each exercise (do more repetitions). If you do not  have pain in your back, do these exercises once each day or as told by your doctor. Exercises Single Knee to Chest  Do these steps 3-5 times in a row for each leg: 1. Lie on your back on a firm bed or the floor with your legs stretched out. 2. Bring one knee to your chest. 3. Hold your knee to your chest by grabbing your knee or thigh. 4. Pull on your knee until you feel a gentle stretch in your lower back. 5. Keep doing the stretch for 10-30 seconds. 6. Slowly let go of your leg and straighten it.  Pelvic Tilt  Do these steps 5-10 times in a row: 1. Lie on your back on a firm bed or the floor with your legs stretched out. 2. Bend your knees so they point up to the ceiling. Your feet should be flat on the floor. 3. Tighten your lower belly (abdomen) muscles to press your lower back against the floor. This will make your tailbone point up to the ceiling instead of pointing down to your feet or the floor. 4. Stay in this position for 5-10 seconds while you gently tighten your muscles and breathe evenly.  Cat-Cow  Do these steps until your lower back bends more easily: 1. Get on your hands and knees on a firm surface. Keep your hands under your shoulders, and keep your knees under your hips. You may put padding under your knees. 2. Let  your head hang down, and make your tailbone point down to the floor so your lower back is round like the back of a cat. 3. Stay in this position for 5 seconds. 4. Slowly lift your head and make your tailbone point up to the ceiling so your back hangs low (sags) like the back of a cow. 5. Stay in this position for 5 seconds.  Press-Ups  Do these steps 5-10 times in a row: 1. Lie on your belly (face-down) on the floor. 2. Place your hands near your head, about shoulder-width apart. 3. While you keep your back relaxed and keep your hips on the floor, slowly straighten your arms to raise the top half of your body and lift your shoulders. Do not use your back  muscles. To make yourself more comfortable, you may change where you place your hands. 4. Stay in this position for 5 seconds. 5. Slowly return to lying flat on the floor.  Bridges  Do these steps 10 times in a row: 1. Lie on your back on a firm surface. 2. Bend your knees so they point up to the ceiling. Your feet should be flat on the floor. 3. Tighten your butt muscles and lift your butt off of the floor until your waist is almost as high as your knees. If you do not feel the muscles working in your butt and the back of your thighs, slide your feet 1-2 inches farther away from your butt. 4. Stay in this position for 3-5 seconds. 5. Slowly lower your butt to the floor, and let your butt muscles relax.  If this exercise is too easy, try doing it with your arms crossed over your chest. Belly Crunches  Do these steps 5-10 times in a row: 1. Lie on your back on a firm bed or the floor with your legs stretched out. 2. Bend your knees so they point up to the ceiling. Your feet should be flat on the floor. 3. Cross your arms over your chest. 4. Tip your chin a little bit toward your chest but do not bend your neck. 5. Tighten your belly muscles and slowly raise your chest just enough to lift your shoulder blades a tiny bit off of the floor. 6. Slowly lower your chest and your head to the floor.  Back Lifts Do these steps 5-10 times in a row: 1. Lie on your belly (face-down) with your arms at your sides, and rest your forehead on the floor. 2. Tighten the muscles in your legs and your butt. 3. Slowly lift your chest off of the floor while you keep your hips on the floor. Keep the back of your head in line with the curve in your back. Look at the floor while you do this. 4. Stay in this position for 3-5 seconds. 5. Slowly lower your chest and your face to the floor.  Contact a doctor if:  Your back pain gets a lot worse when you do an exercise.  Your back pain does not lessen 2 hours  after you exercise. If you have any of these problems, stop doing the exercises. Do not do them again unless your doctor says it is okay. Get help right away if:  You have sudden, very bad back pain. If this happens, stop doing the exercises. Do not do them again unless your doctor says it is okay. This information is not intended to replace advice given to you by your health care provider. Make sure you  discuss any questions you have with your health care provider. Document Released: 05/22/2010 Document Revised: 09/25/2015 Document Reviewed: 06/13/2014 Elsevier Interactive Patient Education  2018 [employer] Back Pain, Adult Back pain is very common. The pain often gets better over time. The cause of back pain is usually not dangerous. Most people can learn to manage their back pain on their own. Follow these instructions at home: Watch your back pain for any changes. The following actions may help to lessen any pain you are feeling:  Stay active. Start with short walks on flat ground if you can. Try to walk farther each day.  Exercise regularly as told by your doctor. Exercise helps your back heal faster. It also helps avoid future injury by keeping your muscles strong and flexible.  Do not sit, drive, or stand in one place for more than 30 minutes.  Do not stay in bed. Resting more than 1-2 days can slow down your recovery.  Be careful when you bend or lift an object. Use good form when lifting: ? Bend at your knees. ? Keep the object close to your body. ? Do not twist.  Sleep on a firm mattress. Lie on your side, and bend your knees. If you lie on your back, put a pillow under your knees.  Take medicines only as told by your doctor.  Put ice on the injured area. ? Put ice in a plastic bag. ? Place a towel between your skin and the bag. ? Leave the ice on for 20 minutes, 2-3 times a day for the first 2-3 days. After that, you can switch between ice and heat packs.  Avoid  feeling anxious or stressed. Find good ways to deal with stress, such as exercise.  Maintain a healthy weight. Extra weight puts stress on your back.  Contact a doctor if:  You have pain that does not go away with rest or medicine.  You have worsening pain that goes down into your legs or buttocks.  You have pain that does not get better in one week.  You have pain at night.  You lose weight.  You have a fever or chills. Get help right away if:  You cannot control when you poop (bowel movement) or pee (urinate).  Your arms or legs feel weak.  Your arms or legs lose feeling (numbness).  You feel sick to your stomach (nauseous) or throw up (vomit).  You have belly (abdominal) pain.  You feel like you may pass out (faint). This information is not intended to replace advice given to you by your health care provider. Make sure you discuss any questions you have with your health care provider. Document Released: 10/06/2007 Document Revised: 09/25/2015 Document Reviewed: 08/21/2013 Elsevier Interactive Patient Education  [employer].

## 2018-04-25 ENCOUNTER — Encounter: Payer: Self-pay | Admitting: Family Medicine

## 2018-05-08 ENCOUNTER — Other Ambulatory Visit: Payer: Self-pay | Admitting: Physician Assistant

## 2018-05-08 NOTE — Telephone Encounter (Signed)
Last office visit 04-11-2018.  Prior office visit 11-14-2017.   Both were acute visits.  Please advise on medication request.

## 2018-05-09 MED ORDER — VALACYCLOVIR HCL 1 G PO TABS: 1000.0000 mg | ORAL_TABLET | Freq: Two times a day (BID) | ORAL | 0 refills | Status: DC

## 2018-05-09 NOTE — Telephone Encounter (Signed)
Will send refill in x1.  If needs additional going forward, will need to be seen for consideration of chronic suppression.

## 2018-05-15 ENCOUNTER — Encounter: Payer: Self-pay | Admitting: Family Medicine

## 2018-05-15 ENCOUNTER — Ambulatory Visit (INDEPENDENT_AMBULATORY_CARE_PROVIDER_SITE_OTHER): Payer: 59 | Admitting: Family Medicine

## 2018-05-15 ENCOUNTER — Telehealth: Payer: Self-pay | Admitting: Family Medicine

## 2018-05-15 VITALS — BP 132/84 | HR 104 | Temp 97.7°F | Ht 61.0 in | Wt 152.0 lb

## 2018-05-15 DIAGNOSIS — L700 Acne vulgaris: Secondary | ICD-10-CM

## 2018-05-15 MED ORDER — TRETINOIN 0.01 % EX GEL: Freq: Every day | CUTANEOUS | 2 refills | Status: DC

## 2018-05-15 MED ORDER — DOXYCYCLINE HYCLATE 50 MG PO CAPS: 50.0000 mg | ORAL_CAPSULE | Freq: Two times a day (BID) | ORAL | 0 refills | Status: AC

## 2018-05-15 MED ORDER — BENZOYL PEROXIDE 5 % EX LIQD: Freq: Two times a day (BID) | CUTANEOUS | 12 refills | Status: DC

## 2018-05-15 NOTE — Addendum Note (Signed)
Addended by: Raliegh Ip on: 05/15/2018 05:35 PM   Modules accepted: Orders

## 2018-05-15 NOTE — Telephone Encounter (Signed)
I have sent in benzoyl peroxide wash. Ok to continue oral antibiotic.  Use wash 1 to 2 times daily.  Continue topical moisturizer.  Let me know if there are any other issues.

## 2018-05-15 NOTE — Patient Instructions (Signed)
I am putting you on a two-week course of oral antibiotics to help clear up your skin.  Make sure that you take this with food.  You may also need to use topical sunscreen as it does increase your risk for sunburns.  You may start the topical acne gel later this week.  I would like you to apply this only 1-2 times per week to start with.  You may increase to once daily as your skin tolerates.  We can see each other back in 3 months for recheck.  If you do well on the topical that is prescribed today, we will plan to keep you on this to keep skin clear.  Acne Plan  Products: Face Wash:  Use a gentle cleanser, such as Cetaphil (generic version of this is fine) Moisturizer:  Use an "oil-free" moisturizer with SPF Prescription Cream(s):  Retin A gel at bedtime  Morning: Wash face, then completely dry Apply Moisturizer to entire face  Bedtime: Wash face, then completely dry Apply Retin A, pea size amount that you massage into problem areas on the face.  Remember: - Your acne will probably get worse before it gets better - It takes at least 2 months for the medicines to start working - Use oil free soaps and lotions; these can be over the counter or store-brand - Don't use harsh scrubs or astringents, these can make skin irritation and acne worse - Moisturize daily with oil free lotion because the acne medicines will dry your skin  Call your doctor if you have: - Lots of skin dryness or redness that doesn't get better if you use a moisturizer or if you use the prescription cream or lotion every other day    Stop using the acne medicine immediately and see your doctor if you are or become pregnant or if you think you had an allergic reaction (itchy rash, difficulty breathing, nausea, vomiting) to your acne medication.

## 2018-05-15 NOTE — Progress Notes (Addendum)
Subjective: CC: Acne PCP: Raliegh IpGottschalk, Tyrelle Raczka M, DO ZOX:[id number]:Fryda Conni Elliot Balcerzak is a 28 y.o. female presenting to clinic today for:  1. Acne Patient reports longstanding history of acne.  She notes that she was treated by dermatology as a teenager with a topical gel, which she responded well to.  She is tried "everything over-the-counter" and is currently using Neutrogena wash and moisturizer for skin care.  She is also tried OTC Differin gel but did not find this especially helpful.  She does not wear make-up.  She washes her face both morning and evening but still has issues with acne.  She is wishing to go on something prescription for this.  Of note, she is treated with a depo for contraception.   ROS: Per HPI  No Known Allergies Past Medical History:  Diagnosis Date  . Herpes   . Medical history non-contributory   . No pertinent past medical history     Current Outpatient Medications:  .  escitalopram (LEXAPRO) 20 MG tablet, Take 1 tablet (20 mg total) by mouth daily., Disp: 30 tablet, Rfl: 1 .  medroxyPROGESTERone (DEPO-PROVERA) 150 MG/ML injection, Inject 1 mL (150 mg total) into the muscle every 3 (three) months., Disp: 1 mL, Rfl: 2 .  naproxen (NAPROSYN) 500 MG tablet, Take 1 tablet (500 mg total) by mouth 2 (two) times daily with a meal., Disp: 60 tablet, Rfl: 1 .  valACYclovir (VALTREX) 1000 MG tablet, Take 1 tablet (1,000 mg total) by mouth 2 (two) times daily., Disp: 20 tablet, Rfl: 0  Current Facility-Administered Medications:  .  medroxyPROGESTERone (DEPO-PROVERA) injection 150 mg, 150 mg, Intramuscular, Q90 days, Else Habermann M, DO, 150 mg at 03/03/18 1613 Social History   Socioeconomic History  . Marital status: Single    Spouse name: Not on file  . Number of children: 1  . Years of education: Not on file  . Highest education level: Not on file  Occupational History  . Not on file  Social Needs  . Financial resource strain: Not on file  . Food insecurity:   Worry: Not on file    Inability: Not on file  . Transportation needs:    Medical: Not on file    Non-medical: Not on file  Tobacco Use  . Smoking status: Current Every Day Smoker    Packs/day: 0.50    Years: 5.00    Pack years: 2.50    Types: Cigarettes  . Smokeless tobacco: Never Used  Substance and Sexual Activity  . Alcohol use: No  . Drug use: No  . Sexual activity: Yes    Birth control/protection: Pill  Lifestyle  . Physical activity:    Days per week: Not on file    Minutes per session: Not on file  . Stress: Not on file  Relationships  . Social connections:    Talks on phone: Not on file    Gets together: Not on file    Attends religious service: Not on file    Active member of club or organization: Not on file    Attends meetings of clubs or organizations: Not on file    Relationship status: Not on file  . Intimate partner violence:    Fear of current or ex partner: Not on file    Emotionally abused: Not on file    Physically abused: Not on file    Forced sexual activity: Not on file  Other Topics Concern  . Not on file  Social History Narrative  .  Not on file   Family History  Problem Relation Age of Onset  . Hypertension Maternal Grandmother   . Diabetes Father   . Hypertension Father   . Anxiety disorder Sister     Objective: Office vital signs reviewed. BP 132/88   Pulse (!) 104   Temp 97.7 F (36.5 C) (Oral)   Ht 5\' 1"  (1.549 m)   Wt 152 lb (68.9 kg)   BMI 28.72 kg/m   Physical Examination:  General: Awake, alert, well nourished, No acute distress Skin: Mixed closed and open comedones are noted along bilateral cheeks and in front of ears.  There is also noted along the jawline.  No cystic acne appreciated.  Assessment/ Plan: 28 y.o. female   1. 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   Doxycycline 50 mg p.o. twice daily for the next 2 weeks ordered.  She will start the topical this Friday.  We discussed gradually increasing frequency of use to once daily.  May continue using home face wash and moisturizer.  She is on Depo-Provera for contraception.  She will follow-up in 3 months or sooner if needed.  If she is doing well in 3 months and wishes to continue current regimen, we can plan for refills through the year.   No orders of the defined types were placed in this encounter.  Meds ordered this encounter  Medications  . doxycycline (VIBRAMYCIN) 50 MG capsule    Sig: Take 1 capsule (50 mg total) by mouth 2 (two) times daily for 14 days.    Dispense:  28 capsule    Refill:  0  . tretinoin (RETIN-A) 0.01 % gel    Sig: Apply topically at bedtime.    Dispense:  45 g    Refill:  2    **Addendum: Per patient Retin A not covered.  I have sent in topical benzoyl peroxide wash instead.  Raliegh Ip, DO [facility] [phone]

## 2018-05-16 NOTE — Telephone Encounter (Signed)
Attempted to contact patient - NA °

## 2018-05-26 NOTE — Telephone Encounter (Signed)
Pt was seen in office regarding this and has been addressed. Will close encounter.

## 2018-06-02 ENCOUNTER — Telehealth: Payer: Self-pay | Admitting: Family Medicine

## 2018-06-02 ENCOUNTER — Ambulatory Visit (INDEPENDENT_AMBULATORY_CARE_PROVIDER_SITE_OTHER): Payer: 59 | Admitting: *Deleted

## 2018-06-02 ENCOUNTER — Other Ambulatory Visit: Payer: Self-pay

## 2018-06-02 DIAGNOSIS — Z3042 Encounter for surveillance of injectable contraceptive: Secondary | ICD-10-CM

## 2018-06-02 MED ORDER — MEDROXYPROGESTERONE ACETATE 150 MG/ML IM SUSP: 150.0000 mg | INTRAMUSCULAR | 2 refills | Status: DC

## 2018-06-02 NOTE — Telephone Encounter (Signed)
done

## 2018-06-02 NOTE — Telephone Encounter (Signed)
What is the name of the medication? MedroxyProgesterone   Have you contacted your pharmacy to request a refill? YES  Which pharmacy would you like this sent to? [facility] in [place]   Patient notified that their request is being sent to the clinical staff for review and that they should receive a call once it is complete. If they do not receive a call within 24 hours they can check with their pharmacy or our office.

## 2018-06-02 NOTE — Progress Notes (Signed)
Pt given Medroxyprogesterone inj Tolerated well 

## 2018-06-09 ENCOUNTER — Ambulatory Visit: Payer: 59 | Admitting: Family Medicine

## 2018-06-14 ENCOUNTER — Encounter: Payer: Self-pay | Admitting: Family Medicine

## 2018-06-16 ENCOUNTER — Ambulatory Visit (INDEPENDENT_AMBULATORY_CARE_PROVIDER_SITE_OTHER): Payer: 59 | Admitting: Family Medicine

## 2018-06-16 VITALS — BP 132/84 | HR 101 | Temp 98.1°F | Ht 61.0 in | Wt 147.0 lb

## 2018-06-16 DIAGNOSIS — N3001 Acute cystitis with hematuria: Secondary | ICD-10-CM | POA: Diagnosis not present

## 2018-06-16 LAB — MICROSCOPIC EXAMINATION: Renal Epithel, UA: NONE SEEN /hpf

## 2018-06-16 LAB — URINALYSIS, COMPLETE
BILIRUBIN UA: NEGATIVE
Glucose, UA: NEGATIVE
Ketones, UA: NEGATIVE
Nitrite, UA: NEGATIVE
Protein, UA: NEGATIVE
Specific Gravity, UA: 1.02 (ref 1.005–1.030)
Urobilinogen, Ur: 2 mg/dL — ABNORMAL HIGH (ref 0.2–1.0)
pH, UA: 6.5 (ref 5.0–7.5)

## 2018-06-16 MED ORDER — CEPHALEXIN 500 MG PO CAPS: 500.0000 mg | ORAL_CAPSULE | Freq: Two times a day (BID) | ORAL | 0 refills | Status: AC

## 2018-06-16 MED ORDER — PHENAZOPYRIDINE HCL 200 MG PO TABS: 200.0000 mg | ORAL_TABLET | Freq: Three times a day (TID) | ORAL | 0 refills | Status: DC | PRN

## 2018-06-16 NOTE — Progress Notes (Signed)
Subjective: CC: UTi PCP: Raliegh Ip, DO XMI:WOEHOZ Megan Leon is a 28 y.o. female presenting to clinic today for:  1. UTI Patient reports onset of dysuria, urinary urgency and difficulty starting a urinary stream for the last 2 days.  She notes that symptoms started on Wednesday evening and she noticed scant hematuria at that time as well.  She reports frequent sexual activity.  No abnormal vaginal discharge.  No vaginal itching suggestive of yeast infection.  Had some scant nausea with eating recently but no vomiting.  No CVA pain.  She is on Depo-Provera for contraception.   ROS: Per HPI  No Known Allergies Past Medical History:  Diagnosis Date  . Herpes   . Medical history non-contributory   . No pertinent past medical history     Current Outpatient Medications:  .  benzoyl peroxide (BENZOYL PEROXIDE) 5 % external liquid, Apply topically 2 (two) times daily., Disp: 142 g, Rfl: 12 .  escitalopram (LEXAPRO) 20 MG tablet, Take 1 tablet (20 mg total) by mouth daily., Disp: 30 tablet, Rfl: 1 .  medroxyPROGESTERone (DEPO-PROVERA) 150 MG/ML injection, Inject 1 mL (150 mg total) into the muscle every 3 (three) months., Disp: 1 mL, Rfl: 2 .  tretinoin (RETIN-A) 0.01 % gel, Apply topically at bedtime., Disp: 45 g, Rfl: 2 .  valACYclovir (VALTREX) 1000 MG tablet, Take 1 tablet (1,000 mg total) by mouth 2 (two) times daily., Disp: 20 tablet, Rfl: 0  Current Facility-Administered Medications:  .  medroxyPROGESTERone (DEPO-PROVERA) injection 150 mg, 150 mg, Intramuscular, Q90 days, Anjali Manzella M, DO, 150 mg at 06/02/18 1607 Social History   Socioeconomic History  . Marital status: Single    Spouse name: Not on file  . Number of children: 1  . Years of education: Not on file  . Highest education level: Not on file  Occupational History  . Not on file  Social Needs  . Financial resource strain: Not on file  . Food insecurity:    Worry: Not on file    Inability: Not on  file  . Transportation needs:    Medical: Not on file    Non-medical: Not on file  Tobacco Use  . Smoking status: Current Every Day Smoker    Packs/day: 0.50    Years: 5.00    Pack years: 2.50    Types: Cigarettes  . Smokeless tobacco: Never Used  Substance and Sexual Activity  . Alcohol use: No  . Drug use: No  . Sexual activity: Yes    Birth control/protection: Pill  Lifestyle  . Physical activity:    Days per week: Not on file    Minutes per session: Not on file  . Stress: Not on file  Relationships  . Social connections:    Talks on phone: Not on file    Gets together: Not on file    Attends religious service: Not on file    Active member of club or organization: Not on file    Attends meetings of clubs or organizations: Not on file    Relationship status: Not on file  . Intimate partner violence:    Fear of current or ex partner: Not on file    Emotionally abused: Not on file    Physically abused: Not on file    Forced sexual activity: Not on file  Other Topics Concern  . Not on file  Social History Narrative  . Not on file   Family History  Problem Relation Age of  Onset  . Hypertension Maternal Grandmother   . Diabetes Father   . Hypertension Father   . Anxiety disorder Sister     Objective: Office vital signs reviewed. BP 132/84   Pulse (!) 101   Temp 98.1 F (36.7 C) (Oral)   Ht 5\' 1"  (1.549 m)   Wt 147 lb (66.7 kg)   BMI 27.78 kg/m   Physical Examination:  General: Awake, alert, well nourished, No acute distress GU: +suprapubic TTP. No CVA TTP  Assessment/ Plan: 28 y.o. female   1. Acute cystitis with hematuria Patient is afebrile and nontoxic-appearing.  Her urinalysis with 1+ blood, 1+ leukocytes.  Urine microscopy pending.  Sent for urine culture.  Physical exam is clinically consistent with UTI.  Start Keflex p.o. twice daily for the next 7 days.  Pyridium also sent.  Encourage p.o. fluids.  She will follow-up PRN. - Urinalysis,  Complete - Urine Culture   Orders Placed This Encounter  Procedures  . Urine Culture  . Urinalysis, Complete   Meds ordered this encounter  Medications  . cephALEXin (KEFLEX) 500 MG capsule    Sig: Take 1 capsule (500 mg total) by mouth 2 (two) times daily for 7 days.    Dispense:  14 capsule    Refill:  0  . phenazopyridine (PYRIDIUM) 200 MG tablet    Sig: Take 1 tablet (200 mg total) by mouth 3 (three) times daily as needed for pain (x2 days).    Dispense:  6 tablet    Refill:  0     Jacalynn Buzzell Hulen Skains, DO [facility] [phone]

## 2018-06-16 NOTE — Patient Instructions (Signed)
Urinary Tract Infection, Adult A urinary tract infection (UTI) is an infection of any part of the urinary tract. The urinary tract includes:  The kidneys.  The ureters.  The bladder.  The urethra. These organs make, store, and get rid of pee (urine) in the body. What are the causes? This is caused by germs (bacteria) in your genital area. These germs grow and cause swelling (inflammation) of your urinary tract. What increases the risk? You are more likely to develop this condition if:  You have a small, thin tube (catheter) to drain pee.  You cannot control when you pee or poop (incontinence).  You are female, and: ? You use these methods to prevent pregnancy: ? A medicine that kills sperm (spermicide). ? A device that blocks sperm (diaphragm). ? You have low levels of a female hormone (estrogen). ? You are pregnant.  You have genes that add to your risk.  You are sexually active.  You take antibiotic medicines.  You have trouble peeing because of: ? A prostate that is bigger than normal, if you are female. ? A blockage in the part of your body that drains pee from the bladder (urethra). ? A kidney stone. ? A nerve condition that affects your bladder (neurogenic bladder). ? Not getting enough to drink. ? Not peeing often enough.  You have other conditions, such as: ? Diabetes. ? A weak disease-fighting system (immune system). ? Sickle cell disease. ? Gout. ? Injury of the spine. What are the signs or symptoms? Symptoms of this condition include:  Needing to pee right away (urgently).  Peeing often.  Peeing small amounts often.  Pain or burning when peeing.  Blood in the pee.  Pee that smells bad or not like normal.  Trouble peeing.  Pee that is cloudy.  Fluid coming from the vagina, if you are female.  Pain in the belly or lower back. Other symptoms include:  Throwing up (vomiting).  No urge to eat.  Feeling mixed up (confused).  Being tired  and grouchy (irritable).  A fever.  Watery poop (diarrhea). How is this treated? This condition may be treated with:  Antibiotic medicine.  Other medicines.  Drinking enough water. Follow these instructions at home:  Medicines  Take over-the-counter and prescription medicines only as told by your doctor.  If you were prescribed an antibiotic medicine, take it as told by your doctor. Do not stop taking it even if you start to feel better. General instructions  Make sure you: ? Pee until your bladder is empty. ? Do not hold pee for a long time. ? Empty your bladder after sex. ? Wipe from front to back after pooping if you are a female. Use each tissue one time when you wipe.  Drink enough fluid to keep your pee pale yellow.  Keep all follow-up visits as told by your doctor. This is important. Contact a doctor if:  You do not get better after 1-2 days.  Your symptoms go away and then come back. Get help right away if:  You have very bad back pain.  You have very bad pain in your lower belly.  You have a fever.  You are sick to your stomach (nauseous).  You are throwing up. Summary  A urinary tract infection (UTI) is an infection of any part of the urinary tract.  This condition is caused by germs in your genital area.  There are many risk factors for a UTI. These include having a small, thin   tube to drain pee and not being able to control when you pee or poop.  Treatment includes antibiotic medicines for germs.  Drink enough fluid to keep your pee pale yellow. This information is not intended to replace advice given to you by your health care provider. Make sure you discuss any questions you have with your health care provider. Document Released: 10/06/2007 Document Revised: 10/27/2017 Document Reviewed: 10/27/2017 Elsevier Interactive Patient Education  2019 [employer]  

## 2018-06-18 LAB — URINE CULTURE

## 2018-06-19 ENCOUNTER — Telehealth: Payer: Self-pay | Admitting: Family Medicine

## 2018-06-19 NOTE — Telephone Encounter (Signed)
Aware of results. 

## 2018-06-23 ENCOUNTER — Encounter: Payer: Self-pay | Admitting: Family Medicine

## 2018-07-28 ENCOUNTER — Encounter: Payer: Self-pay | Admitting: Family Medicine

## 2018-07-28 MED ORDER — ESCITALOPRAM OXALATE 20 MG PO TABS: 20.0000 mg | ORAL_TABLET | Freq: Every day | ORAL | 2 refills | Status: DC

## 2018-08-22 ENCOUNTER — Encounter: Payer: Self-pay | Admitting: Family Medicine

## 2018-08-22 ENCOUNTER — Encounter: Payer: Self-pay | Admitting: *Deleted

## 2018-08-24 ENCOUNTER — Telehealth: Payer: 59 | Admitting: Nurse Practitioner

## 2018-08-24 DIAGNOSIS — B9789 Other viral agents as the cause of diseases classified elsewhere: Secondary | ICD-10-CM

## 2018-08-24 DIAGNOSIS — J329 Chronic sinusitis, unspecified: Principal | ICD-10-CM

## 2018-08-24 MED ORDER — FLUTICASONE PROPIONATE 50 MCG/ACT NA SUSP: 2.0000 | Freq: Every day | NASAL | 0 refills | Status: DC

## 2018-08-24 MED ORDER — CETIRIZINE HCL 10 MG PO TABS: 10.0000 mg | ORAL_TABLET | Freq: Every day | ORAL | 0 refills | Status: DC

## 2018-08-24 NOTE — Progress Notes (Signed)
We are sorry that you are not feeling well.  Here is how we plan to help!  Based on what you have shared with me it looks like you have sinusitis.  Sinusitis is inflammation and infection in the sinus cavities of the head.  Based on your presentation I believe you most likely have Acute Viral Sinusitis.This is an infection most likely caused by a virus. There is not specific treatment for viral sinusitis other than to help you with the symptoms until the infection runs its course.  You may use an oral decongestant such as Mucinex D or if you have glaucoma or high blood pressure use plain Mucinex. Saline nasal spray help and can safely be used as often as needed for congestion, I have prescribed: Fluticasone nasal spray two sprays in each nostril once a day.  I am also prescribing Zyrtec 10mg  daily. It would be helpful to purchase over-the-counter Sudafed to take as directed.   Providers prescribe antibiotics to treat infections caused by bacteria. Antibiotics are very powerful in treating bacterial infections when they are used properly. To maintain their effectiveness, they should be used only when necessary. Overuse of antibiotics has resulted in the development of superbugs that are resistant to treatment.  Based on your symptoms and the duration of your symptoms, an antibiotic is not recommended at this time.    After careful review of your answers, I would not recommend an antibiotic for your condition.  Antibiotics are not effective against viruses and therefore should not be used to treat them. Common examples of infections caused by viruses include colds and flu   Some authorities believe that zinc sprays or the use of Echinacea may shorten the course of your symptoms.  Sinus infections are not as easily transmitted as other respiratory infection, however we still recommend that you avoid close contact with loved ones, especially the very young and elderly.  Remember to wash your hands thoroughly  throughout the day as this is the number one way to prevent the spread of infection!  Home Care:  Only take medications as instructed by your medical team.  Do not take these medications with alcohol.  A steam or ultrasonic humidifier can help congestion.  You can place a towel over your head and breathe in the steam from hot water coming from a faucet.  Avoid close contacts especially the very young and the elderly.  Cover your mouth when you cough or sneeze.  Always remember to wash your hands.  Get Help Right Away If:  You develop worsening fever or sinus pain.  You develop a severe head ache or visual changes.  Your symptoms persist after you have completed your treatment plan.  Make sure you  Understand these instructions.  Will watch your condition.  Will get help right away if you are not doing well or get worse.  Your e-visit answers were reviewed by a board certified advanced clinical practitioner to complete your personal care plan.  Depending on the condition, your plan could have included both over the counter or prescription medications.  If there is a problem please reply  once you have received a response from your provider.  Your safety is important to us.  If you have drug allergies check your prescription carefully.    You can use MyChart to ask questions about todays visit, request a non-urgent call back, or ask for a work or school excuse for 24 hours related to this e-Visit. If it has been greater  than 24 hours you will need to follow up with your provider, or enter a new e-Visit to address those concerns.  You will get an e-mail in the next two days asking about your experience.  I hope that your e-visit has been valuable and will speed your recovery. Thank you for using e-visits.

## 2018-08-28 ENCOUNTER — Ambulatory Visit (INDEPENDENT_AMBULATORY_CARE_PROVIDER_SITE_OTHER): Payer: Medicaid Other | Admitting: Family Medicine

## 2018-08-28 ENCOUNTER — Other Ambulatory Visit: Payer: Self-pay

## 2018-08-28 ENCOUNTER — Encounter: Payer: Self-pay | Admitting: Family Medicine

## 2018-08-28 DIAGNOSIS — N938 Other specified abnormal uterine and vaginal bleeding: Secondary | ICD-10-CM | POA: Diagnosis not present

## 2018-08-28 DIAGNOSIS — Z3009 Encounter for other general counseling and advice on contraception: Secondary | ICD-10-CM | POA: Diagnosis not present

## 2018-08-28 MED ORDER — NORGESTIMATE-ETH ESTRADIOL 0.25-35 MG-MCG PO TABS: 1.0000 | ORAL_TABLET | Freq: Every day | ORAL | 2 refills | Status: DC

## 2018-08-28 NOTE — Patient Instructions (Signed)
Contraception Choices  Contraception, also called birth control, refers to methods or devices that prevent pregnancy.  Hormonal methods  Contraceptive implant    A contraceptive implant is a thin, plastic tube that contains a hormone. It is inserted into the upper part of the arm. It can remain in place for up to 3 years.  Progestin-only injections  Progestin-only injections are injections of progestin, a synthetic form of the hormone progesterone. They are given every 3 months by a health care provider.  Birth control pills    Birth control pills are pills that contain hormones that prevent pregnancy. They must be taken once a day, preferably at the same time each day.  Birth control patch    The birth control patch contains hormones that prevent pregnancy. It is placed on the skin and must be changed once a week for three weeks and removed on the fourth week. A prescription is needed to use this method of contraception.  Vaginal ring    A vaginal ring contains hormones that prevent pregnancy. It is placed in the vagina for three weeks and removed on the fourth week. After that, the process is repeated with a new ring. A prescription is needed to use this method of contraception.  Emergency contraceptive  Emergency contraceptives prevent pregnancy after unprotected sex. They come in pill form and can be taken up to 5 days after sex. They work best the sooner they are taken after having sex. Most emergency contraceptives are available without a prescription. This method should not be used as your only form of birth control.  Barrier methods  Female condom    A female condom is a thin sheath that is worn over the penis during sex. Condoms keep sperm from going inside a woman's body. They can be used with a spermicide to increase their effectiveness. They should be disposed after a single use.  Female condom    A female condom is a soft, loose-fitting sheath that is put into the vagina before sex. The condom keeps sperm  from going inside a woman's body. They should be disposed after a single use.  Diaphragm    A diaphragm is a soft, dome-shaped barrier. It is inserted into the vagina before sex, along with a spermicide. The diaphragm blocks sperm from entering the uterus, and the spermicide kills sperm. A diaphragm should be left in the vagina for 6-8 hours after sex and removed within 24 hours.  A diaphragm is prescribed and fitted by a health care provider. A diaphragm should be replaced every 1-2 years, after giving birth, after gaining more than 15 lb (6.8 kg), and after pelvic surgery.  Cervical cap    A cervical cap is a round, soft latex or plastic cup that fits over the cervix. It is inserted into the vagina before sex, along with spermicide. It blocks sperm from entering the uterus. The cap should be left in place for 6-8 hours after sex and removed within 48 hours. A cervical cap must be prescribed and fitted by a health care provider. It should be replaced every 2 years.  Sponge    A sponge is a soft, circular piece of polyurethane foam with spermicide on it. The sponge helps block sperm from entering the uterus, and the spermicide kills sperm. To use it, you make it wet and then insert it into the vagina. It should be inserted before sex, left in for at least 6 hours after sex, and removed and thrown away within   30 hours.  Spermicides  Spermicides are chemicals that kill or block sperm from entering the cervix and uterus. They can come as a cream, jelly, suppository, foam, or tablet. A spermicide should be inserted into the vagina with an applicator at least 10-15 minutes before sex to allow time for it to work. The process must be repeated every time you have sex. Spermicides do not require a prescription.  Intrauterine contraception  Intrauterine device (IUD)  An IUD is a T-shaped device that is put in a woman's uterus. There are two types:   Hormone IUD.This type contains progestin, a synthetic form of the hormone  progesterone. This type can stay in place for 3-5 years.   Copper IUD.This type is wrapped in copper wire. It can stay in place for 10 years.    Permanent methods of contraception  Female tubal ligation  In this method, a woman's fallopian tubes are sealed, tied, or blocked during surgery to prevent eggs from traveling to the uterus.  Hysteroscopic sterilization  In this method, a small, flexible insert is placed into each fallopian tube. The inserts cause scar tissue to form in the fallopian tubes and block them, so sperm cannot reach an egg. The procedure takes about 3 months to be effective. Another form of birth control must be used during those 3 months.  Female sterilization  This is a procedure to tie off the tubes that carry sperm (vasectomy). After the procedure, the man can still ejaculate fluid (semen).  Natural planning methods  Natural family planning  In this method, a couple does not have sex on days when the woman could become pregnant.  Calendar method  This means keeping track of the length of each menstrual cycle, identifying the days when pregnancy can happen, and not having sex on those days.  Ovulation method  In this method, a couple avoids sex during ovulation.  Symptothermal method  This method involves not having sex during ovulation. The woman typically checks for ovulation by watching changes in her temperature and in the consistency of cervical mucus.  Post-ovulation method  In this method, a couple waits to have sex until after ovulation.  Summary   Contraception, also called birth control, means methods or devices that prevent pregnancy.   Hormonal methods of contraception include implants, injections, pills, patches, vaginal rings, and emergency contraceptives.   Barrier methods of contraception can include female condoms, female condoms, diaphragms, cervical caps, sponges, and spermicides.   There are two types of IUDs (intrauterine devices). An IUD can be put in a woman's uterus to  prevent pregnancy for 3-5 years.   Permanent sterilization can be done through a procedure for males, females, or both.   Natural family planning methods involve not having sex on days when the woman could become pregnant.  This information is not intended to replace advice given to you by your health care provider. Make sure you discuss any questions you have with your health care provider.  Document Released: 04/19/2005 Document Revised: 04/21/2017 Document Reviewed: 05/22/2016  Elsevier Interactive Patient Education  2019 [employer] Inc[employer]

## 2018-08-28 NOTE — Progress Notes (Signed)
Telephone visit  Subjective: CC: Contraception counseling PCP: Raliegh Ip, DO FIE:PPIRJJ PAIZLEIGH HANAHAN is a 28 y.o. female calls for telephone consult today. Patient provides verbal consent for consult held via phone.  Location of patient: in car Location of provider: Working remotely from home Others present for call: none  1.Contraceptive counseling Patient presents today for discussion of contraception.  She is a G1P1001 .  She reports her menstrual cycles are irregular when she is not on some sort of birth control.  Previous methods of birth control tried: Microgestin/Depo-Provera.  She is currently on Depo-Provera with last injection 06/02/2018.  She has noticed some weight gain and wonders if this is coming from the Depo-Provera as her diet has not changed.  She is is sexually active with 1 female partner.  She reports heavy menstrual bleeding, excessive cramping when she is not on birth control.  Denies abdominal masses.  No known personal or family history of: liver disease, breast cancer, clotting disorder (including DVT/PE), migraine headaches. She is a smoker. No LMP recorded.  Last pap: UTD  ROS: Per HPI  No Known Allergies Past Medical History:  Diagnosis Date  . Herpes   . Medical history non-contributory   . No pertinent past medical history     Current Outpatient Medications:  .  benzoyl peroxide (BENZOYL PEROXIDE) 5 % external liquid, Apply topically 2 (two) times daily., Disp: 142 g, Rfl: 12 .  cetirizine (ZYRTEC) 10 MG tablet, Take 1 tablet (10 mg total) by mouth daily for 10 days., Disp: 30 tablet, Rfl: 0 .  escitalopram (LEXAPRO) 20 MG tablet, Take 1 tablet (20 mg total) by mouth daily., Disp: 30 tablet, Rfl: 2 .  fluticasone (FLONASE) 50 MCG/ACT nasal spray, Place 2 sprays into both nostrils daily for 10 days., Disp: 16 g, Rfl: 0 .  medroxyPROGESTERone (DEPO-PROVERA) 150 MG/ML injection, Inject 1 mL (150 mg total) into the muscle every 3 (three) months., Disp: 1  mL, Rfl: 2 .  tretinoin (RETIN-A) 0.01 % gel, Apply topically at bedtime., Disp: 45 g, Rfl: 2 .  valACYclovir (VALTREX) 1000 MG tablet, Take 1 tablet (1,000 mg total) by mouth 2 (two) times daily., Disp: 20 tablet, Rfl: 0  Current Facility-Administered Medications:  .  medroxyPROGESTERone (DEPO-PROVERA) injection 150 mg, 150 mg, Intramuscular, Q90 days, Delynn Flavin M, DO, 150 mg at 06/02/18 1607  Assessment/ Plan: 27 y.o. female   1. Dysfunctional uterine bleeding We discussed various options including OCPs, patch, NuvaRing, Nexplanon and IUD.  She wishes to proceed with OCPs.  We will start a monophasic OCP.  Okay to start now.  No urine pregnancy needed as patient's Depo-Provera has not lapsed yet.  She will contact me if the pill is not effective in controlling her dysfunctional uterine bleeding going forward.  I will provide her information with regards to the patch, NuvaRing, Nexplanon and IUD.  She will follow-up in 3 months, sooner if needed - norgestimate-ethinyl estradiol (ORTHO-CYCLEN, 28,) 0.25-35 MG-MCG tablet; Take 1 tablet by mouth daily.  Dispense: 1 Package; Refill: 2  2. General counselling and advice on contraception As above - norgestimate-ethinyl estradiol (ORTHO-CYCLEN, 28,) 0.25-35 MG-MCG tablet; Take 1 tablet by mouth daily.  Dispense: 1 Package; Refill: 2   Start time: 1:31pm End time: 1:41pm  Total time spent on patient care (including telephone call/ virtual visit): 15 mins  Ashly Hulen Skains, DO [facility] [phone]

## 2018-09-01 ENCOUNTER — Telehealth: Payer: Medicaid Other | Admitting: Family

## 2018-09-01 ENCOUNTER — Encounter: Payer: Self-pay | Admitting: Family

## 2018-09-01 DIAGNOSIS — M545 Low back pain, unspecified: Secondary | ICD-10-CM

## 2018-09-01 MED ORDER — CYCLOBENZAPRINE HCL 10 MG PO TABS: 10.0000 mg | ORAL_TABLET | Freq: Two times a day (BID) | ORAL | 0 refills | Status: DC

## 2018-09-01 NOTE — Progress Notes (Signed)
We are sorry that you are not feeling well.  Here is how we plan to help!  Based on what you have shared with me it looks like you mostly have acute back pain.  Acute back pain is defined as musculoskeletal pain that can resolve in 1-3 weeks with conservative treatment.  I have prescribed Flexeril 10 mg every eight hours as needed which is a muscle relaxer  Please keep in mind that muscle relaxer's can cause fatigue and should not be taken while at work or driving.  Back pain is very common.  The pain often gets better over time.  The cause of back pain is usually not dangerous.  Most people can learn to manage their back pain on their own.  Home Care  Stay active.  Start with short walks on flat ground if you can.  Try to walk farther each day.  Do not sit, drive or stand in one place for more than 30 minutes.  Do not stay in bed.  Do not avoid exercise or work.  Activity can help your back heal faster.  Be careful when you bend or lift an object.  Bend at your knees, keep the object close to you, and do not twist.  Sleep on a firm mattress.  Lie on your side, and bend your knees.  If you lie on your back, put a pillow under your knees.  Only take medicines as told by your doctor.  Put ice on the injured area.  Put ice in a plastic bag  Place a towel between your skin and the bag  Leave the ice on for 15-20 minutes, 3-4 times a day for the first 2-3 days. 210 After that, you can switch between ice and heat packs.  Ask your doctor about back exercises or massage.  Avoid feeling anxious or stressed.  Find good ways to deal with stress, such as exercise.  Get Help Right Way If:  Your pain does not go away with rest or medicine.  Your pain does not go away in 1 week.  You have new problems.  You do not feel well.  The pain spreads into your legs.  You cannot control when you poop (bowel movement) or pee (urinate)  You feel sick to your stomach (nauseous) or throw up  (vomit)  You have belly (abdominal) pain.  You feel like you may pass out (faint).  If you develop a fever.  Make Sure you:  Understand these instructions.  Will watch your condition  Will get help right away if you are not doing well or get worse.  Your e-visit answers were reviewed by a board certified advanced clinical practitioner to complete your personal care plan.  Depending on the condition, your plan could have included both over the counter or prescription medications.  If there is a problem please reply  once you have received a response from your provider.  Your safety is important to us.  If you have drug allergies check your prescription carefully.    You can use MyChart to ask questions about today's visit, request a non-urgent call back, or ask for a work or school excuse for 24 hours related to this e-Visit. If it has been greater than 24 hours you will need to follow up with your provider, or enter a new e-Visit to address those concerns.  You will get an e-mail in the next two days asking about your experience.  I hope that your e-visit has been   valuable and will speed your recovery. Thank you for using e-visits. Greater than 5 minutes, yet less than 10 minutes of time have been spent researching, coordinating, and implementing care for this patient today.  Thank you for the details you included in the comment boxes. Those details are very helpful in determining the best course of treatment for you and help us to provide the best care.  

## 2018-09-18 ENCOUNTER — Other Ambulatory Visit: Payer: Self-pay | Admitting: Nurse Practitioner

## 2018-09-18 DIAGNOSIS — B9789 Other viral agents as the cause of diseases classified elsewhere: Secondary | ICD-10-CM

## 2018-09-19 ENCOUNTER — Other Ambulatory Visit: Payer: Self-pay | Admitting: Family Medicine

## 2018-09-19 ENCOUNTER — Other Ambulatory Visit: Payer: Self-pay | Admitting: Nurse Practitioner

## 2018-09-19 DIAGNOSIS — B9789 Other viral agents as the cause of diseases classified elsewhere: Secondary | ICD-10-CM

## 2018-09-19 MED ORDER — VALACYCLOVIR HCL 1 G PO TABS: 1000.0000 mg | ORAL_TABLET | Freq: Two times a day (BID) | ORAL | 0 refills | Status: DC

## 2018-09-20 ENCOUNTER — Telehealth: Payer: Self-pay | Admitting: Family Medicine

## 2018-10-03 ENCOUNTER — Encounter: Payer: Self-pay | Admitting: Family Medicine

## 2018-10-05 ENCOUNTER — Ambulatory Visit: Payer: Medicaid Other | Admitting: Family Medicine

## 2018-10-05 ENCOUNTER — Encounter: Payer: Self-pay | Admitting: Family Medicine

## 2018-10-05 ENCOUNTER — Other Ambulatory Visit: Payer: Self-pay

## 2018-10-05 VITALS — BP 129/81 | HR 105 | Temp 99.0°F | Ht 61.0 in | Wt 143.0 lb

## 2018-10-05 DIAGNOSIS — Z3A01 Less than 8 weeks gestation of pregnancy: Secondary | ICD-10-CM

## 2018-10-05 DIAGNOSIS — Z3200 Encounter for pregnancy test, result unknown: Secondary | ICD-10-CM

## 2018-10-05 DIAGNOSIS — N912 Amenorrhea, unspecified: Secondary | ICD-10-CM

## 2018-10-05 LAB — PREGNANCY, URINE: Preg Test, Ur: POSITIVE — AB

## 2018-10-05 MED ORDER — PRENATAL MULTIVITAMIN CH: 1.0000 | ORAL_TABLET | Freq: Every day | ORAL | 3 refills | Status: AC

## 2018-10-05 NOTE — Progress Notes (Signed)
Subjective:  Patient ID: Megan Leon, female    DOB: 20-Sep-1990, 28 y.o.   MRN: [id number]  Chief Complaint:  Possible Pregnancy   HPI: Megan Leon is a 28 y.o. female presenting on 10/05/2018 for Possible Pregnancy  Pt presents today for pregnancy test. States she has taken two home pregnancy tests that were positive. States she would like this confirmed. Is in a monogamous relationship. LMP 08/31/2018. She denies nausea, vomiting, or breast tenderness. States she did have one episode of hot flashes, states that subsided. G2P1A0. Normal first pregnancy, no complications, vaginal delivery. She is not taking prenatal vitamins or folic acid.   Relevant past medical, surgical, family, and social history reviewed and updated as indicated.  Allergies and medications reviewed and updated.   Past Medical History:  Diagnosis Date  . Herpes   . Medical history non-contributory   . No pertinent past medical history     Past Surgical History:  Procedure Laterality Date  . NO PAST SURGERIES      Social History   Socioeconomic History  . Marital status: Single    Spouse name: Not on file  . Number of children: 1  . Years of education: Not on file  . Highest education level: Not on file  Occupational History  . Not on file  Social Needs  . Financial resource strain: Not on file  . Food insecurity:    Worry: Not on file    Inability: Not on file  . Transportation needs:    Medical: Not on file    Non-medical: Not on file  Tobacco Use  . Smoking status: Current Every Day Smoker    Packs/day: 0.50    Years: 5.00    Pack years: 2.50    Types: Cigarettes  . Smokeless tobacco: Never Used  Substance and Sexual Activity  . Alcohol use: No  . Drug use: No  . Sexual activity: Yes    Birth control/protection: Pill  Lifestyle  . Physical activity:    Days per week: Not on file    Minutes per session: Not on file  . Stress: Not on file  Relationships  . Social  connections:    Talks on phone: Not on file    Gets together: Not on file    Attends religious service: Not on file    Active member of club or organization: Not on file    Attends meetings of clubs or organizations: Not on file    Relationship status: Not on file  . Intimate partner violence:    Fear of current or ex partner: Not on file    Emotionally abused: Not on file    Physically abused: Not on file    Forced sexual activity: Not on file  Other Topics Concern  . Not on file  Social History Narrative  . Not on file    Outpatient Encounter Medications as of 10/05/2018  Medication Sig  . benzoyl peroxide (BENZOYL PEROXIDE) 5 % external liquid Apply topically 2 (two) times daily.  Marland Kitchen escitalopram (LEXAPRO) 20 MG tablet Take 1 tablet (20 mg total) by mouth daily.  . valACYclovir (VALTREX) 1000 MG tablet Take 1 tablet (1,000 mg total) by mouth 2 (two) times daily.  . [DISCONTINUED] norgestimate-ethinyl estradiol (ORTHO-CYCLEN, 28,) 0.25-35 MG-MCG tablet Take 1 tablet by mouth daily.  . [DISCONTINUED] tretinoin (RETIN-A) 0.01 % gel Apply topically at bedtime.  . Prenatal Vit-Fe Fumarate-FA (PRENATAL MULTIVITAMIN) TABS tablet Take 1 tablet by mouth  daily at 12 noon for 30 days.  . [DISCONTINUED] cetirizine (ZYRTEC) 10 MG tablet Take 1 tablet (10 mg total) by mouth daily for 10 days.  . [DISCONTINUED] cyclobenzaprine (FLEXERIL) 10 MG tablet Take 1 tablet (10 mg total) by mouth 2 (two) times daily.  . [DISCONTINUED] fluticasone (FLONASE) 50 MCG/ACT nasal spray Place 2 sprays into both nostrils daily for 10 days.  . [DISCONTINUED] medroxyPROGESTERone (DEPO-PROVERA) 150 MG/ML injection Inject 1 mL (150 mg total) into the muscle every 3 (three) months.  . [DISCONTINUED] medroxyPROGESTERone (DEPO-PROVERA) injection 150 mg    No facility-administered encounter medications on file as of 10/05/2018.     No Known Allergies  Review of Systems  Respiratory: Negative for cough and shortness of  breath.   Cardiovascular: Negative for chest pain, palpitations and leg swelling.  Gastrointestinal: Negative for abdominal pain, nausea and vomiting.  Genitourinary: Positive for menstrual problem. Negative for pelvic pain, vaginal bleeding, vaginal discharge and vaginal pain.  Musculoskeletal: Negative for back pain and myalgias.  Neurological: Negative for headaches.  Psychiatric/Behavioral: Negative for confusion.  All other systems reviewed and are negative.       Objective:  BP 129/81   Pulse (!) 105   Temp 99 F (37.2 C) (Oral)   Ht 5\' 1"  (1.549 m)   Wt 143 lb (64.9 kg)   LMP 08/31/2018 (Exact Date)   BMI 27.02 kg/m    Wt Readings from Last 3 Encounters:  10/05/18 143 lb (64.9 kg)  06/16/18 147 lb (66.7 kg)  05/15/18 152 lb (68.9 kg)    Physical Exam Vitals signs and nursing note reviewed.  Constitutional:      General: She is not in acute distress.    Appearance: Normal appearance. She is well-developed and well-groomed. She is not ill-appearing, toxic-appearing or diaphoretic.  HENT:     Head: Normocephalic and atraumatic.     Jaw: There is normal jaw occlusion.     Right Ear: Hearing normal.     Left Ear: Hearing normal.     Nose: Nose normal.     Mouth/Throat:     Lips: Pink.     Mouth: Mucous membranes are moist.     Pharynx: Oropharynx is clear. Uvula midline.  Eyes:     General: Lids are normal.     Extraocular Movements: Extraocular movements intact.     Conjunctiva/sclera: Conjunctivae normal.     Pupils: Pupils are equal, round, and reactive to light.  Neck:     Musculoskeletal: Normal range of motion and neck supple.     Thyroid: No thyroid mass, thyromegaly or thyroid tenderness.     Vascular: No carotid bruit or JVD.     Trachea: Trachea and phonation normal.  Cardiovascular:     Rate and Rhythm: Normal rate and regular rhythm.     Chest Wall: PMI is not displaced.     Pulses: Normal pulses.     Heart sounds: Normal heart sounds. No  murmur. No friction rub. No gallop.   Pulmonary:     Effort: Pulmonary effort is normal. No respiratory distress.     Breath sounds: Normal breath sounds. No wheezing.  Abdominal:     General: Bowel sounds are normal. There is no distension or abdominal bruit.     Palpations: Abdomen is soft. There is no hepatomegaly or splenomegaly.     Tenderness: There is no abdominal tenderness. There is no right CVA tenderness or left CVA tenderness.     Hernia: No hernia is  present.  Musculoskeletal: Normal range of motion.     Right lower leg: No edema.     Left lower leg: No edema.  Lymphadenopathy:     Cervical: No cervical adenopathy.  Skin:    General: Skin is warm and dry.     Capillary Refill: Capillary refill takes less than 2 seconds.     Coloration: Skin is not cyanotic, jaundiced or pale.     Findings: No rash.  Neurological:     General: No focal deficit present.     Mental Status: She is alert and oriented to person, place, and time.     Cranial Nerves: Cranial nerves are intact.     Sensory: Sensation is intact.     Motor: Motor function is intact.     Coordination: Coordination is intact.     Gait: Gait is intact.     Deep Tendon Reflexes: Reflexes are normal and symmetric.  Psychiatric:        Attention and Perception: Attention and perception normal.        Mood and Affect: Mood and affect normal.        Speech: Speech normal.        Behavior: Behavior normal. Behavior is cooperative.        Thought Content: Thought content normal.        Cognition and Memory: Cognition and memory normal.        Judgment: Judgment normal.     Results for orders placed or performed in visit on 06/16/18  Urine Culture  Result Value Ref Range   Urine Culture, Routine Final report (A)    Organism ID, Bacteria Escherichia coli (A)    Antimicrobial Susceptibility Comment   Microscopic Examination  Result Value Ref Range   WBC, UA 11-30 (A) 0 - 5 /hpf   RBC, UA 3-10 (A) 0 - 2 /hpf    Epithelial Cells (non renal) 0-10 0 - 10 /hpf   Renal Epithel, UA None seen None seen /hpf   Bacteria, UA Many (A) None seen/Few  Urinalysis, Complete  Result Value Ref Range   Specific Gravity, UA 1.020 1.005 - 1.030   pH, UA 6.5 5.0 - 7.5   Color, UA Yellow Yellow   Appearance Ur Cloudy (A) Clear   Leukocytes, UA 1+ (A) Negative   Protein, UA Negative Negative/Trace   Glucose, UA Negative Negative   Ketones, UA Negative Negative   RBC, UA 1+ (A) Negative   Bilirubin, UA Negative Negative   Urobilinogen, Ur 2.0 (H) 0.2 - 1.0 mg/dL   Nitrite, UA Negative Negative   Microscopic Examination See below:      Positive urine pregnancy test in office.   Pertinent labs & imaging results that were available during my care of the patient were reviewed by me and considered in my medical decision making.  Assessment & Plan:  [place] was seen today for possible pregnancy.  Diagnoses and all orders for this visit:  Possible pregnancy, not confirmed -     Pregnancy, urine  Less than [redacted] weeks gestation of pregnancy Approximately 5 weeks by LMP. EDD 06/07/2019. Has appointment with OB in early July. Start prenatal vitamins. Pregnancy safe medications discussed.  -     Prenatal Vit-Fe Fumarate-FA (PRENATAL MULTIVITAMIN) TABS tablet; Take 1 tablet by mouth daily at 12 noon for 30 days.     Continue all other maintenance medications.  Follow up plan: Return if symptoms worsen or fail to improve. Follow up with OB,  has an appointment in early July.   Educational handout given for pregnancy and pregnancy safe medications   The above assessment and management plan was discussed with the patient. The patient verbalized understanding of and has agreed to the management plan. Patient is aware to call the clinic if symptoms persist or worsen. Patient is aware when to return to the clinic for a follow-up visit. Patient educated on when it is appropriate to go to the emergency department.   Kari BaarsMichelle  Samiah Ricklefs, FNP-C [facility] [phone]

## 2018-10-05 NOTE — Patient Instructions (Signed)
Safe Medications in Pregnancy  ° °Acne: °Benzoyl Peroxide °Salicylic Acid ° °Backache/Headache: °Tylenol: 2 regular strength every 4 hours OR °             2 Extra strength every 6 hours ° °Colds/Coughs/Allergies: °Benadryl (alcohol free) 25 mg every 6 hours as needed °Breath right strips °Claritin °Cepacol throat lozenges °Chloraseptic throat spray °Cold-Eeze- up to three times per day °Cough drops, alcohol free °Flonase (by prescription only) °Guaifenesin °Mucinex °Robitussin DM (plain only, alcohol free) °Saline nasal spray/drops °Sudafed (pseudoephedrine) & Actifed ** use only after [redacted] weeks gestation and if you do not have high blood pressure °Tylenol °Vicks Vaporub °Zinc lozenges °Zyrtec  ° °Constipation: °Colace °Ducolax suppositories °Fleet enema °Glycerin suppositories °Metamucil °Milk of magnesia °Miralax °Senokot °Smooth move tea ° °Diarrhea: °Kaopectate °Imodium A-D ° °*NO pepto Bismol ° °Hemorrhoids: °Anusol °Anusol HC °Preparation H °Tucks ° °Indigestion: °Tums °Maalox °Mylanta °Zantac  °Pepcid ° °Insomnia: °Benadryl (alcohol free) 25mg every 6 hours as needed °Tylenol PM °Unisom, no Gelcaps ° °Leg Cramps: °Tums °MagGel ° °Nausea/Vomiting:  °Bonine °Dramamine °Emetrol °Ginger extract °Sea bands °Meclizine  °Nausea medication to take during pregnancy:  °Unisom (doxylamine succinate 25 mg tablets) Take one tablet daily at bedtime. If symptoms are not adequately controlled, the dose can be increased to a maximum recommended dose of two tablets daily (1/2 tablet in the morning, 1/2 tablet mid-afternoon and one at bedtime). °Vitamin B6 100mg tablets. Take one tablet twice a day (up to 200 mg per day). ° °Skin Rashes: °Aveeno products °Benadryl cream or 25mg every 6 hours as needed °Calamine Lotion °1% cortisone cream ° °Yeast infection: °Gyne-lotrimin 7 °Monistat 7 ° ° °**If taking multiple medications, please check labels to avoid duplicating the same active ingredients °**take medication as directed on  the label °** Do not exceed 4000 mg of tylenol in 24 hours °**Do not take medications that contain aspirin or ibuprofen ° ° ° ° ° °First Trimester of Pregnancy ° °The first trimester of pregnancy is from week 1 until the end of week 13 (months 1 through 3). During this time, your baby will begin to develop inside you. At 6-8 weeks, the eyes and face are formed, and the heartbeat can be seen on ultrasound. At the end of 12 weeks, all the baby's organs are formed. Prenatal care is all the medical care you receive before the birth of your baby. Make sure you get good prenatal care and follow all of your doctor's instructions. °Follow these instructions at home: °Medicines °· Take over-the-counter and prescription medicines only as told by your doctor. Some medicines are safe and some medicines are not safe during pregnancy. °· Take a prenatal vitamin that contains at least 600 micrograms (mcg) of folic acid. °· If you have trouble pooping (constipation), take medicine that will make your stool soft (stool softener) if your doctor approves. °Eating and drinking ° °· Eat regular, healthy meals. °· Your doctor will tell you the amount of weight gain that is right for you. °· Avoid raw meat and uncooked cheese. °· If you feel sick to your stomach (nauseous) or throw up (vomit): °? Eat 4 or 5 small meals a day instead of 3 large meals. °? Try eating a few soda crackers. °? Drink liquids between meals instead of during meals. °· To prevent constipation: °? Eat foods that are high in fiber, like fresh fruits and vegetables, whole grains, and beans. °? Drink enough fluids to keep your pee (urine) clear or pale   yellow. °Activity °· Exercise only as told by your doctor. Stop exercising if you have cramps or pain in your lower belly (abdomen) or low back. °· Do not exercise if it is too hot, too humid, or if you are in a place of great height (high altitude). °· Try to avoid standing for long periods of time. Move your legs often  if you must stand in one place for a long time. °· Avoid heavy lifting. °· Wear low-heeled shoes. Sit and stand up straight. °· You can have sex unless your doctor tells you not to. °Relieving pain and discomfort °· Wear a good support bra if your breasts are sore. °· Take warm water baths (sitz baths) to soothe pain or discomfort caused by hemorrhoids. Use hemorrhoid cream if your doctor says it is okay. °· Rest with your legs raised if you have leg cramps or low back pain. °· If you have puffy, bulging veins (varicose veins) in your legs: °? Wear support hose or compression stockings as told by your doctor. °? Raise (elevate) your feet for 15 minutes, 3-4 times a day. °? Limit salt in your food. °Prenatal care °· Schedule your prenatal visits by the twelfth week of pregnancy. °· Write down your questions. Take them to your prenatal visits. °· Keep all your prenatal visits as told by your doctor. This is important. °Safety °· Wear your seat belt at all times when driving. °· Make a list of emergency phone numbers. The list should include numbers for family, friends, the hospital, and police and fire departments. °General instructions °· Ask your doctor for a referral to a local prenatal class. Begin classes no later than at the start of month 6 of your pregnancy. °· Ask for help if you need counseling or if you need help with nutrition. Your doctor can give you advice or tell you where to go for help. °· Do not use hot tubs, steam rooms, or saunas. °· Do not douche or use tampons or scented sanitary pads. °· Do not cross your legs for long periods of time. °· Avoid all herbs and alcohol. Avoid drugs that are not approved by your doctor. °· Do not use any tobacco products, including cigarettes, chewing tobacco, and electronic cigarettes. If you need help quitting, ask your doctor. You may get counseling or other support to help you quit. °· Avoid cat litter boxes and soil used by cats. These carry germs that can  cause birth defects in the baby and can cause a loss of your baby (miscarriage) or stillbirth. °· Visit your dentist. At home, brush your teeth with a soft toothbrush. Be gentle when you floss. °Contact a doctor if: °· You are dizzy. °· You have mild cramps or pressure in your lower belly. °· You have a nagging pain in your belly area. °· You continue to feel sick to your stomach, you throw up, or you have watery poop (diarrhea). °· You have a bad smelling fluid coming from your vagina. °· You have pain when you pee (urinate). °· You have increased puffiness (swelling) in your face, hands, legs, or ankles. °Get help right away if: °· You have a fever. °· You are leaking fluid from your vagina. °· You have spotting or bleeding from your vagina. °· You have very bad belly cramping or pain. °· You gain or lose weight rapidly. °· You throw up blood. It may look like coffee grounds. °· You are around people who have German measles, fifth   disease, or chickenpox. °· You have a very bad headache. °· You have shortness of breath. °· You have any kind of trauma, such as from a fall or a car accident. °Summary °· The first trimester of pregnancy is from week 1 until the end of week 13 (months 1 through 3). °· To take care of yourself and your unborn baby, you will need to eat healthy meals, take medicines only if your doctor tells you to do so, and do activities that are safe for you and your baby. °· Keep all follow-up visits as told by your doctor. This is important as your doctor will have to ensure that your baby is healthy and growing well. °This information is not intended to replace advice given to you by your health care provider. Make sure you discuss any questions you have with your health care provider. °Document Released: 10/06/2007 Document Revised: 04/27/2016 Document Reviewed: 04/27/2016 °Elsevier Interactive Patient Education © 2019 [employer] ° °

## 2018-11-05 ENCOUNTER — Encounter: Payer: Self-pay | Admitting: Family Medicine

## 2018-11-13 ENCOUNTER — Telehealth: Payer: Self-pay | Admitting: Family Medicine

## 2018-11-28 ENCOUNTER — Encounter: Payer: Self-pay | Admitting: Family Medicine

## 2018-11-29 ENCOUNTER — Telehealth: Payer: Self-pay | Admitting: Family Medicine

## 2018-11-29 DIAGNOSIS — Z3201 Encounter for pregnancy test, result positive: Secondary | ICD-10-CM

## 2018-11-29 NOTE — Telephone Encounter (Signed)
LMP 08/26/2018

## 2018-11-29 NOTE — Telephone Encounter (Signed)
Is it ok to place referral? 

## 2018-11-29 NOTE — Telephone Encounter (Signed)
Yes

## 2018-11-29 NOTE — Telephone Encounter (Signed)
Left patient a message she does not need a referral unless patient pt has Medicaid.

## 2018-11-29 NOTE — Telephone Encounter (Signed)
Patient has [employer] for insurance needs a referral put in for [facility], for pregnancy.

## 2018-12-04 ENCOUNTER — Ambulatory Visit: Payer: Medicaid Other | Admitting: Family Medicine

## 2018-12-05 ENCOUNTER — Encounter: Payer: Self-pay | Admitting: Family Medicine

## 2018-12-25 ENCOUNTER — Encounter: Payer: Self-pay | Admitting: Family Medicine

## 2019-01-08 ENCOUNTER — Telehealth: Payer: Medicaid Other | Admitting: Family

## 2019-01-08 DIAGNOSIS — J069 Acute upper respiratory infection, unspecified: Secondary | ICD-10-CM

## 2019-01-08 MED ORDER — FLUTICASONE PROPIONATE 50 MCG/ACT NA SUSP: 2.0000 | Freq: Every day | NASAL | 6 refills | Status: DC

## 2019-01-08 NOTE — Progress Notes (Signed)
We are sorry you are not feeling well.  Here is how we plan to help!  Based on what you have shared with me, it looks like you may have a viral upper respiratory infection.  Upper respiratory infections are caused by a large number of viruses; however, rhinovirus is the most common cause.   Symptoms vary from person to person, with common symptoms including sore throat, cough, fatigue or lack of energy and feeling of general discomfort.  A low-grade fever of up to 100.4 may present, but is often uncommon.  Symptoms vary however, and are closely related to a person's age or underlying illnesses.  toThe most common symptoms associated with an upper respiratory infection are nasal discharge or congestion, cough, sneezing, headache and pressure in the ears and face.  These symptoms usually persist for about 3 to 10 days, but can last up to 2 weeks.  It is important to know that upper respiratory infections do not cause serious illness or complications in most cases.    Upper respiratory infections can be transmitted from person to person, with the most common method of transmission being a person's hands.  The virus is able to live on the skin and can infect other persons for up to 2 hours after direct contact.  Also, these can be transmitted when someone coughs or sneezes; thus, it is important to cover the mouth to reduce this risk.  To keep the spread of the illness at bay, good hand hygiene is very important.  This is an infection that is most likely caused by a virus. There are no specific treatments other than to help you with the symptoms until the infection runs its course.  We are sorry you are not feeling well.  Here is how we plan to help!   For nasal congestion, you may use an oral decongestants such as Mucinex D or if you have glaucoma or high blood pressure use plain Mucinex.  Saline nasal spray or nasal drops can help and can safely be used as often as needed for congestion.  For your  congestion, I have prescribed Fluticasone nasal spray one spray in each nostril twice a day  If you do not have a history of heart disease, hypertension, diabetes or thyroid disease, prostate/bladder issues or glaucoma, you may also use Sudafed to treat nasal congestion.  It is highly recommended that you consult with a pharmacist or your primary care physician to ensure this medication is safe for you to take.     Get in your symptoms you do need to be Covid tested. You can go to one of the  testing sites listed below, while they are opened (see hours). You do not need an order and will stay in your car during the test. You do need to self isolate until your results return and if positive 14 days from when your symptoms started and until you are 3 days symptom free.   Testing Locations (Monday - Friday, 8 a.m. - 3:30 p.m.) . [place]: [facility], [address], [place], [place]  . [place]: [address] [facility], [address], [place], [place] (entrance off [address])  . [facility]: (Closed each Monday): testing will be relocated to the short stay covered drive at [facility]. (Use the [facility] [address] entrance to [facility] next to [facility].)    I have sent in a work note to your my chart. Approximately 5  minutes was spent documenting and reviewing patient's chart.    If you have a sore or scratchy throat, use a saltwater gargle-  to  teaspoon of salt dissolved in a 4-ounce to 8-ounce glass of warm water.  Gargle the solution for approximately 15-30 seconds and then spit.  It is important not to swallow the solution.  You can also use throat lozenges/cough drops and Chloraseptic spray to help with throat pain or discomfort.  Warm or cold liquids can also be helpful in relieving throat For headache, pain or general discomfort, you can use Ibuprofen or Tylenol as directed.   Some authorities believe that zinc  sprays or the use of Echinacea may shorten the course of your symptoms.   HOME CARE . Only take medications as instructed by your medical team. . Be sure to drink plenty of fluids. Water is fine as well as fruit juices, sodas and electrolyte beverages. You may want to stay away from caffeine or alcohol. If you are nauseated, try taking small sips of liquids. How do you know if you are getting enough fluid? Your urine should be a pale yellow or almost colorless. . Get rest. . Taking a steamy shower or using a humidifier may help nasal congestion and ease sore throat pain. You can place a towel over your head and breathe in the steam from hot water coming from a faucet. . Using a saline nasal spray works much the same way. . Cough drops, hard candies and sore throat lozenges may ease your cough. . Avoid close contacts especially the very young and the elderly . Cover your mouth if you cough or sneeze . Always remember to wash your hands.   GET HELP RIGHT AWAY IF: . You develop worsening fever. . If your symptoms do not improve within 10 days . You develop yellow or green discharge from your nose over 3 days. . You have coughing fits . You develop a severe head ache or visual changes. . You develop shortness of breath, difficulty breathing or start having chest pain . Your symptoms persist after you have completed your treatment plan  MAKE SURE YOU   Understand these instructions.  Will watch your condition.  Will get help right away if you are not doing well or get worse.  Your e-visit answers were reviewed by a board certified advanced clinical practitioner to complete your personal care plan. Depending upon the condition, your plan could have included both over the counter or prescription medications. Please review your pharmacy choice. If there is a problem, you may call our nursing hot line at and have the prescription routed to another pharmacy. Your safety is important to [place]. If  you have drug allergies check your prescription carefully.   You can use MyChart to ask questions about today's visit, request a non-urgent call back, or ask for a work or school excuse for 24 hours related to this e-Visit. If it has been greater than 24 hours you will need to follow up with your provider, or enter a new e-Visit to address those concerns. You will get an e-mail in the next two days asking about your experience.  I hope that your e-visit has been valuable and will speed your recovery. Thank you for using e-visits.

## 2019-01-09 ENCOUNTER — Other Ambulatory Visit: Payer: Self-pay | Admitting: Nurse Practitioner

## 2019-01-09 DIAGNOSIS — B9789 Other viral agents as the cause of diseases classified elsewhere: Secondary | ICD-10-CM

## 2019-01-09 DIAGNOSIS — J329 Chronic sinusitis, unspecified: Secondary | ICD-10-CM

## 2019-01-18 ENCOUNTER — Telehealth: Payer: Medicaid Other | Admitting: Physician Assistant

## 2019-01-18 DIAGNOSIS — M545 Low back pain, unspecified: Secondary | ICD-10-CM

## 2019-01-18 MED ORDER — ACETAMINOPHEN 325 MG PO TABS: 650.0000 mg | ORAL_TABLET | Freq: Four times a day (QID) | ORAL | 0 refills | Status: AC | PRN

## 2019-01-18 NOTE — Progress Notes (Signed)
We are sorry that you are not feeling well.  Here is how we plan to help!  Based on what you have shared with me it looks like you mostly have acute back pain.  Acute back pain is defined as musculoskeletal pain that can resolve in 1-3 weeks with conservative treatment.  I have prescribed tylenol 650 mgs.  Back pain is very common.  The pain often gets better over time.  The cause of back pain is usually not dangerous.  Most people can learn to manage their back pain on their own.  Home Care  Stay active.  Start with short walks on flat ground if you can.  Try to walk farther each day.  Do not sit, drive or stand in one place for more than 30 minutes.  Do not stay in bed.  Do not avoid exercise or work.  Activity can help your back heal faster.  Be careful when you bend or lift an object.  Bend at your knees, keep the object close to you, and do not twist.  Sleep on a firm mattress.  Lie on your side, and bend your knees.  If you lie on your back, put a pillow under your knees.  Only take medicines as told by your doctor.  Put ice on the injured area.  Put ice in a plastic bag  Place a towel between your skin and the bag  Leave the ice on for 15-20 minutes, 3-4 times a day for the first 2-3 days. 210 After that, you can switch between ice and heat packs.  Ask your doctor about back exercises or massage.  Avoid feeling anxious or stressed.  Find good ways to deal with stress, such as exercise.  Get Help Right Way If:  Your pain does not go away with rest or medicine.  Your pain does not go away in 1 week.  You have new problems.  You do not feel well.  The pain spreads into your legs.  You cannot control when you poop (bowel movement) or pee (urinate)  You feel sick to your stomach (nauseous) or throw up (vomit)  You have belly (abdominal) pain.  You feel like you may pass out (faint).  If you develop a fever.  Make Sure you:  Understand these  instructions.  Will watch your condition  Will get help right away if you are not doing well or get worse.  Your e-visit answers were reviewed by a board certified advanced clinical practitioner to complete your personal care plan.  Depending on the condition, your plan could have included both over the counter or prescription medications.  If there is a problem please reply  once you have received a response from your provider.  Your safety is important to [place].  If you have drug allergies check your prescription carefully.    You can use MyChart to ask questions about today's visit, request a non-urgent call back, or ask for a work or school excuse for 24 hours related to this e-Visit. If it has been greater than 24 hours you will need to follow up with your provider, or enter a new e-Visit to address those concerns.  You will get an e-mail in the next two days asking about your experience.  I hope that your e-visit has been valuable and will speed your recovery. Thank you for using e-visits.  Greater than 5 minutes, yet less than 10 minutes of time have been spent researching, coordinating, and implementing care  for this patient today

## 2019-01-26 ENCOUNTER — Ambulatory Visit (INDEPENDENT_AMBULATORY_CARE_PROVIDER_SITE_OTHER): Payer: Medicaid Other | Admitting: Family Medicine

## 2019-01-26 ENCOUNTER — Encounter: Payer: Self-pay | Admitting: Family Medicine

## 2019-01-26 DIAGNOSIS — H9201 Otalgia, right ear: Secondary | ICD-10-CM

## 2019-01-26 DIAGNOSIS — G43809 Other migraine, not intractable, without status migrainosus: Secondary | ICD-10-CM

## 2019-01-26 NOTE — Patient Instructions (Signed)

## 2019-01-26 NOTE — Progress Notes (Signed)
Telephone visit  Subjective: CC: migraine PCP: Janora Norlander, DO WUX:LKGMWN Megan Leon is a 28 y.o. female calls for telephone consult today. Patient provides verbal consent for consult held via phone.  Location of patient: home Location of provider: [facility] Others present for call: none  1. Ear pain/ migraine Patient reports that she started feeling bad last night.  She was "up and down" most of the night.  She has right ear pain.  Denies drainage, fevers, cough.  She has allergies and has intermittent sinus symptoms (congestion) since change in weather.  She took Tylenol only because she is pregnant.  She was sick last week with sinusitis.  She reports photophobia.  She reports headache has eased with Tylenol.   ROS: Per HPI  No Known Allergies Past Medical History:  Diagnosis Date  . Herpes   . Medical history non-contributory   . No pertinent past medical history     Current Outpatient Medications:  .  acetaminophen (TYLENOL) 325 MG tablet, Take 2 tablets (650 mg total) by mouth every 6 (six) hours as needed., Disp: 30 tablet, Rfl: 0 .  benzoyl peroxide (BENZOYL PEROXIDE) 5 % external liquid, Apply topically 2 (two) times daily., Disp: 142 g, Rfl: 12 .  escitalopram (LEXAPRO) 20 MG tablet, Take 1 tablet (20 mg total) by mouth daily., Disp: 30 tablet, Rfl: 2 .  fluticasone (FLONASE) 50 MCG/ACT nasal spray, Place 2 sprays into both nostrils daily., Disp: 16 g, Rfl: 6 .  valACYclovir (VALTREX) 1000 MG tablet, Take 1 tablet (1,000 mg total) by mouth 2 (two) times daily., Disp: 20 tablet, Rfl: 0  Assessment/ Plan: 28 y.o. female   1. Right ear pain I suspect that she is having eustachian tube dysfunction given recent sinusitis.  I have recommended that she use Claritin, which we discussed is okay in pregnancy.  Okay to continue Flonase.  Encouraged clear fluids.  May need to consider use of warm compress on the ear to promote drainage.  Handout provided in her my chart for  eustachian tube drainage.  Work note provided.  We discussed if symptoms worsen or she develops any bacterial signs and symptoms of infection that she is to contact me and we will start empiric antibiotics.  2. Other migraine without status migrainosus, not intractable Resolving with Tylenol.   Start time: 1:10pm End time: 1:17pm  Total time spent on patient care (including telephone call/ virtual visit): 13 minutes  [place], [place] [phone]

## 2019-02-21 DIAGNOSIS — A6004 Herpesviral vulvovaginitis: Secondary | ICD-10-CM | POA: Insufficient documentation

## 2019-02-25 ENCOUNTER — Encounter: Payer: Self-pay | Admitting: Family Medicine

## 2019-04-18 ENCOUNTER — Telehealth: Payer: Medicaid Other | Admitting: Nurse Practitioner

## 2019-04-18 DIAGNOSIS — R05 Cough: Secondary | ICD-10-CM

## 2019-04-18 DIAGNOSIS — R059 Cough, unspecified: Secondary | ICD-10-CM

## 2019-04-18 MED ORDER — BENZONATATE 100 MG PO CAPS: 100.0000 mg | ORAL_CAPSULE | Freq: Three times a day (TID) | ORAL | 0 refills | Status: DC | PRN

## 2019-04-18 NOTE — Progress Notes (Signed)
We are sorry that you are not feeling well.  Here is how we plan to help!  Based on your presentation I believe you most likely have A cough due to a virus.  This is called viral bronchitis and is best treated by rest, plenty of fluids and control of the cough.  You may use Ibuprofen or Tylenol as directed to help your symptoms.     In addition you may use robitussin OTC for cough since you are pregnant  * if your symptoms worsen or you develop SOB you my want to be tested for covid. If you need an appointment, please text "COVID" to [phone], OR they can log on to [email or web address] to easily make an on-line appointment.   If you have access to a smart phone or PC, you  can call  [phone] to get assistance.    [facility]:  APPOINTMENT Go-LIVE - Tuesday, Dec 15th;  CLOSED:  Monday, Dec 14th for the move to [address], [place], [place] [address] . [facility]:  APPOINTMENT Go-Live - Wednesday, Dec 16th; CLOSED Tuesday, Dec 15th for the move indoors (same location)  . [facility]: APPOINTMENT Go-Live - Wednesday, Dec 16th; CLOSED Tuesday, Dec 15th for the move indoors (same location)    From your responses in the eVisit questionnaire you describe inflammation in the upper respiratory tract which is causing a significant cough.  This is commonly called Bronchitis and has four common causes:    Allergies  Viral Infections  Acid Reflux  Bacterial Infection Allergies, viruses and acid reflux are treated by controlling symptoms or eliminating the cause. An example might be a cough caused by taking certain blood pressure medications. You stop the cough by changing the medication. Another example might be a cough caused by acid reflux. Controlling the reflux helps control the cough.  USE OF BRONCHODILATOR ("RESCUE") INHALERS: There is a risk from using your bronchodilator too frequently.  The risk is that over-reliance on a medication which only relaxes the muscles surrounding the  breathing tubes can reduce the effectiveness of medications prescribed to reduce swelling and congestion of the tubes themselves.  Although you feel brief relief from the bronchodilator inhaler, your asthma may actually be worsening with the tubes becoming more swollen and filled with mucus.  This can delay other crucial treatments, such as oral steroid medications. If you need to use a bronchodilator inhaler daily, several times per day, you should discuss this with your provider.  There are probably better treatments that could be used to keep your asthma under control.     HOME CARE . Only take medications as instructed by your medical team. . Complete the entire course of an antibiotic. . Drink plenty of fluids and get plenty of rest. . Avoid close contacts especially the very young and the elderly . Cover your mouth if you cough or cough into your sleeve. . Always remember to wash your hands . A steam or ultrasonic humidifier can help congestion.   GET HELP RIGHT AWAY IF: . You develop worsening fever. . You become short of breath . You cough up blood. . Your symptoms persist after you have completed your treatment plan MAKE SURE YOU   Understand these instructions.  Will watch your condition.  Will get help right away if you are not doing well or get worse.  Your e-visit answers were reviewed by a board certified advanced clinical practitioner to complete your personal care plan.  Depending on the condition, your  plan could have included both over the counter or prescription medications. If there is a problem please reply  once you have received a response from your provider. Your safety is important to [place].  If you have drug allergies check your prescription carefully.    You can use MyChart to ask questions about today's visit, request a non-urgent call back, or ask for a work or school excuse for 24 hours related to this e-Visit. If it has been greater than 24 hours you will need  to follow up with your provider, or enter a new e-Visit to address those concerns. You will get an e-mail in the next two days asking about your experience.  I hope that your e-visit has been valuable and will speed your recovery. Thank you for using e-visits.  5-10 minutes spent reviewing and documenting in chart.

## 2019-06-12 ENCOUNTER — Inpatient Hospital Stay (HOSPITAL_COMMUNITY)
Admission: AD | Admit: 2019-06-12 | Discharge: 2019-06-14 | DRG: 788 | Disposition: A | Discharge status: Home / Self Care | Payer: Medicaid Other | Attending: Family Medicine | Admitting: Family Medicine

## 2019-06-12 ENCOUNTER — Inpatient Hospital Stay (HOSPITAL_COMMUNITY): Admission: RE | Admit: 2019-06-12 | Payer: Medicaid Other | Source: Home / Self Care

## 2019-06-12 ENCOUNTER — Encounter (HOSPITAL_COMMUNITY): Payer: Self-pay | Admitting: Obstetrics and Gynecology

## 2019-06-12 ENCOUNTER — Inpatient Hospital Stay (HOSPITAL_COMMUNITY): Payer: Medicaid Other | Admitting: Anesthesiology

## 2019-06-12 ENCOUNTER — Other Ambulatory Visit: Payer: Self-pay

## 2019-06-12 ENCOUNTER — Encounter (HOSPITAL_COMMUNITY)
Admission: AD | Disposition: A | Discharge status: Home / Self Care | Payer: Self-pay | Source: Home / Self Care | Attending: Family Medicine

## 2019-06-12 DIAGNOSIS — O26893 Other specified pregnancy related conditions, third trimester: Secondary | ICD-10-CM | POA: Diagnosis present

## 2019-06-12 DIAGNOSIS — F32A Depression, unspecified: Secondary | ICD-10-CM | POA: Diagnosis present

## 2019-06-12 DIAGNOSIS — O9832 Other infections with a predominantly sexual mode of transmission complicating childbirth: Principal | ICD-10-CM | POA: Diagnosis present

## 2019-06-12 DIAGNOSIS — F17210 Nicotine dependence, cigarettes, uncomplicated: Secondary | ICD-10-CM | POA: Diagnosis present

## 2019-06-12 DIAGNOSIS — O9852 Other viral diseases complicating childbirth: Secondary | ICD-10-CM

## 2019-06-12 DIAGNOSIS — O99334 Smoking (tobacco) complicating childbirth: Secondary | ICD-10-CM | POA: Diagnosis present

## 2019-06-12 DIAGNOSIS — Z23 Encounter for immunization: Secondary | ICD-10-CM

## 2019-06-12 DIAGNOSIS — A6000 Herpesviral infection of urogenital system, unspecified: Secondary | ICD-10-CM | POA: Diagnosis present

## 2019-06-12 DIAGNOSIS — Z3A40 40 weeks gestation of pregnancy: Secondary | ICD-10-CM

## 2019-06-12 DIAGNOSIS — Z20822 Contact with and (suspected) exposure to covid-19: Secondary | ICD-10-CM | POA: Diagnosis present

## 2019-06-12 DIAGNOSIS — B009 Herpesviral infection, unspecified: Secondary | ICD-10-CM

## 2019-06-12 DIAGNOSIS — F17200 Nicotine dependence, unspecified, uncomplicated: Secondary | ICD-10-CM | POA: Diagnosis present

## 2019-06-12 DIAGNOSIS — F329 Major depressive disorder, single episode, unspecified: Secondary | ICD-10-CM | POA: Diagnosis present

## 2019-06-12 LAB — RESPIRATORY PANEL BY RT PCR (FLU A&B, COVID)
Influenza A by PCR: NEGATIVE
Influenza B by PCR: NEGATIVE
SARS Coronavirus 2 by RT PCR: NEGATIVE

## 2019-06-12 LAB — TYPE AND SCREEN
ABO/RH(D): O POS
Antibody Screen: NEGATIVE

## 2019-06-12 LAB — CBC
HCT: 41.3 % (ref 36.0–46.0)
Hemoglobin: 14.1 g/dL (ref 12.0–15.0)
MCH: 32.1 pg (ref 26.0–34.0)
MCHC: 34.1 g/dL (ref 30.0–36.0)
MCV: 94.1 fL (ref 80.0–100.0)
Platelets: 248 10*3/uL (ref 150–400)
RBC: 4.39 MIL/uL (ref 3.87–5.11)
RDW: 12.4 % (ref 11.5–15.5)
WBC: 16.7 10*3/uL — ABNORMAL HIGH (ref 4.0–10.5)
nRBC: 0 % (ref 0.0–0.2)

## 2019-06-12 LAB — ABO/RH: ABO/RH(D): O POS

## 2019-06-12 LAB — CREATININE, SERUM
Creatinine, Ser: 0.6 mg/dL (ref 0.44–1.00)
GFR calc Af Amer: >60 mL/min (ref >60)
GFR calc non Af Amer: >60 mL/min (ref >60)

## 2019-06-12 SURGERY — Surgical Case
Anesthesia: Spinal | Wound class: Clean Contaminated

## 2019-06-12 MED ORDER — COCONUT OIL OIL: 1.0000 "application " | TOPICAL_OIL | Status: DC | PRN

## 2019-06-12 MED ORDER — OXYTOCIN 10 UNIT/ML IJ SOLN
INTRAMUSCULAR | Status: AC
  Filled 2019-06-12: qty 1

## 2019-06-12 MED ORDER — OXYTOCIN 40 UNITS IN NORMAL SALINE INFUSION - SIMPLE MED
INTRAVENOUS | Status: AC
  Filled 2019-06-12: qty 1000

## 2019-06-12 MED ORDER — LACTATED RINGERS IV SOLN: INTRAVENOUS | Status: DC

## 2019-06-12 MED ORDER — DIPHENHYDRAMINE HCL 25 MG PO CAPS: 25.0000 mg | ORAL_CAPSULE | Freq: Four times a day (QID) | ORAL | Status: DC | PRN

## 2019-06-12 MED ORDER — KETOROLAC TROMETHAMINE 30 MG/ML IJ SOLN
INTRAMUSCULAR | Status: AC
  Filled 2019-06-12: qty 1

## 2019-06-12 MED ORDER — SIMETHICONE 80 MG PO CHEW
80.0000 mg | CHEWABLE_TABLET | ORAL | Status: DC | PRN
  Filled 2019-06-12: qty 1

## 2019-06-12 MED ORDER — FENTANYL-BUPIVACAINE-NACL 0.5-0.125-0.9 MG/250ML-% EP SOLN
12.0000 mL/h | EPIDURAL | Status: DC | PRN
  Filled 2019-06-12: qty 250

## 2019-06-12 MED ORDER — ONDANSETRON HCL 4 MG/2ML IJ SOLN
INTRAMUSCULAR | Status: AC
  Filled 2019-06-12: qty 2

## 2019-06-12 MED ORDER — SIMETHICONE 80 MG PO CHEW
80.0000 mg | CHEWABLE_TABLET | Freq: Three times a day (TID) | ORAL | Status: DC
  Administered 2019-06-13 – 2019-06-14 (×4): 80 mg via ORAL
  Filled 2019-06-12 (×4): qty 1

## 2019-06-12 MED ORDER — ACETAMINOPHEN 325 MG PO TABS
650.0000 mg | ORAL_TABLET | Freq: Four times a day (QID) | ORAL | Status: DC | PRN
  Administered 2019-06-13 (×2): 650 mg via ORAL
  Filled 2019-06-12 (×2): qty 2

## 2019-06-12 MED ORDER — NALOXONE HCL 4 MG/10ML IJ SOLN
1.0000 ug/kg/h | INTRAVENOUS | Status: DC | PRN
  Filled 2019-06-12: qty 5

## 2019-06-12 MED ORDER — NALBUPHINE HCL 10 MG/ML IJ SOLN: 5.0000 mg | Freq: Once | INTRAMUSCULAR | Status: DC | PRN

## 2019-06-12 MED ORDER — OXYTOCIN 40 UNITS IN NORMAL SALINE INFUSION - SIMPLE MED: 2.5000 [IU]/h | INTRAVENOUS | Status: DC

## 2019-06-12 MED ORDER — OXYTOCIN 40 UNITS IN NORMAL SALINE INFUSION - SIMPLE MED: 2.5000 [IU]/h | INTRAVENOUS | Status: AC

## 2019-06-12 MED ORDER — NALBUPHINE HCL 10 MG/ML IJ SOLN
5.0000 mg | Freq: Once | INTRAMUSCULAR | Status: DC | PRN
  Filled 2019-06-12: qty 1

## 2019-06-12 MED ORDER — OXYCODONE HCL 5 MG PO TABS
5.0000 mg | ORAL_TABLET | ORAL | Status: DC | PRN
  Administered 2019-06-13: 23:00:00 5 mg via ORAL
  Filled 2019-06-12: qty 1

## 2019-06-12 MED ORDER — MEPERIDINE HCL 25 MG/ML IJ SOLN: 6.2500 mg | INTRAMUSCULAR | Status: DC | PRN

## 2019-06-12 MED ORDER — DEXAMETHASONE SODIUM PHOSPHATE 10 MG/ML IJ SOLN
INTRAMUSCULAR | Status: AC
  Filled 2019-06-12: qty 1

## 2019-06-12 MED ORDER — LIDOCAINE HCL (PF) 1 % IJ SOLN: 30.0000 mL | INTRAMUSCULAR | Status: DC | PRN

## 2019-06-12 MED ORDER — HYDROMORPHONE HCL 1 MG/ML IJ SOLN: 0.2500 mg | INTRAMUSCULAR | Status: DC | PRN

## 2019-06-12 MED ORDER — SCOPOLAMINE 1 MG/3DAYS TD PT72
1.0000 | MEDICATED_PATCH | Freq: Once | TRANSDERMAL | Status: DC
  Administered 2019-06-12: 21:00:00 1.5 mg via TRANSDERMAL
  Filled 2019-06-12: qty 1

## 2019-06-12 MED ORDER — DIPHENHYDRAMINE HCL 50 MG/ML IJ SOLN: 12.5000 mg | INTRAMUSCULAR | Status: DC | PRN

## 2019-06-12 MED ORDER — TETANUS-DIPHTH-ACELL PERTUSSIS 5-2.5-18.5 LF-MCG/0.5 IM SUSP: 0.5000 mL | Freq: Once | INTRAMUSCULAR | Status: DC

## 2019-06-12 MED ORDER — OXYCODONE-ACETAMINOPHEN 5-325 MG PO TABS: 1.0000 | ORAL_TABLET | ORAL | Status: DC | PRN

## 2019-06-12 MED ORDER — CEFAZOLIN SODIUM-DEXTROSE 2-3 GM-%(50ML) IV SOLR
INTRAVENOUS | Status: DC | PRN
  Administered 2019-06-12: 2 g via INTRAVENOUS

## 2019-06-12 MED ORDER — PHENYLEPHRINE HCL-NACL 20-0.9 MG/250ML-% IV SOLN
INTRAVENOUS | Status: DC | PRN
  Administered 2019-06-12: 60 ug/min via INTRAVENOUS

## 2019-06-12 MED ORDER — OXYCODONE-ACETAMINOPHEN 5-325 MG PO TABS: 2.0000 | ORAL_TABLET | ORAL | Status: DC | PRN

## 2019-06-12 MED ORDER — OXYCODONE HCL 5 MG PO TABS: 5.0000 mg | ORAL_TABLET | Freq: Once | ORAL | Status: DC | PRN

## 2019-06-12 MED ORDER — OXYCODONE HCL 5 MG/5ML PO SOLN: 5.0000 mg | Freq: Once | ORAL | Status: DC | PRN

## 2019-06-12 MED ORDER — NALBUPHINE HCL 10 MG/ML IJ SOLN: 5.0000 mg | INTRAMUSCULAR | Status: DC | PRN

## 2019-06-12 MED ORDER — ACETAMINOPHEN 325 MG PO TABS: 650.0000 mg | ORAL_TABLET | ORAL | Status: DC | PRN

## 2019-06-12 MED ORDER — DIPHENHYDRAMINE HCL 25 MG PO CAPS
25.0000 mg | ORAL_CAPSULE | ORAL | Status: DC | PRN
  Administered 2019-06-13: 25 mg via ORAL
  Filled 2019-06-12: qty 1

## 2019-06-12 MED ORDER — LACTATED RINGERS IV SOLN: 500.0000 mL | Freq: Once | INTRAVENOUS | Status: DC

## 2019-06-12 MED ORDER — FENTANYL CITRATE (PF) 100 MCG/2ML IJ SOLN: 100.0000 ug | INTRAMUSCULAR | Status: DC | PRN

## 2019-06-12 MED ORDER — WITCH HAZEL-GLYCERIN EX PADS: 1.0000 "application " | MEDICATED_PAD | CUTANEOUS | Status: DC | PRN

## 2019-06-12 MED ORDER — MORPHINE SULFATE (PF) 0.5 MG/ML IJ SOLN
INTRAMUSCULAR | Status: DC | PRN
  Administered 2019-06-12: 150 ug via INTRATHECAL

## 2019-06-12 MED ORDER — IBUPROFEN 800 MG PO TABS
800.0000 mg | ORAL_TABLET | Freq: Three times a day (TID) | ORAL | Status: DC
  Administered 2019-06-13 – 2019-06-14 (×4): 800 mg via ORAL
  Filled 2019-06-12 (×5): qty 1

## 2019-06-12 MED ORDER — DIBUCAINE (PERIANAL) 1 % EX OINT: 1.0000 "application " | TOPICAL_OINTMENT | CUTANEOUS | Status: DC | PRN

## 2019-06-12 MED ORDER — CEFAZOLIN SODIUM-DEXTROSE 2-4 GM/100ML-% IV SOLN
INTRAVENOUS | Status: AC
  Filled 2019-06-12: qty 100

## 2019-06-12 MED ORDER — SOD CITRATE-CITRIC ACID 500-334 MG/5ML PO SOLN
30.0000 mL | ORAL | Status: DC | PRN
  Administered 2019-06-12: 30 mL via ORAL
  Filled 2019-06-12: qty 30

## 2019-06-12 MED ORDER — ZOLPIDEM TARTRATE 5 MG PO TABS: 5.0000 mg | ORAL_TABLET | Freq: Every evening | ORAL | Status: DC | PRN

## 2019-06-12 MED ORDER — FENTANYL CITRATE (PF) 100 MCG/2ML IJ SOLN
INTRAMUSCULAR | Status: AC
  Filled 2019-06-12: qty 2

## 2019-06-12 MED ORDER — FENTANYL CITRATE (PF) 100 MCG/2ML IJ SOLN
INTRAMUSCULAR | Status: DC | PRN
  Administered 2019-06-12: 15 ug via INTRATHECAL

## 2019-06-12 MED ORDER — NALOXONE HCL 0.4 MG/ML IJ SOLN: 0.4000 mg | INTRAMUSCULAR | Status: DC | PRN

## 2019-06-12 MED ORDER — PROMETHAZINE HCL 25 MG/ML IJ SOLN: 6.2500 mg | INTRAMUSCULAR | Status: DC | PRN

## 2019-06-12 MED ORDER — PHENYLEPHRINE 40 MCG/ML (10ML) SYRINGE FOR IV PUSH (FOR BLOOD PRESSURE SUPPORT): 80.0000 ug | PREFILLED_SYRINGE | INTRAVENOUS | Status: DC | PRN

## 2019-06-12 MED ORDER — ONDANSETRON HCL 4 MG/2ML IJ SOLN
INTRAMUSCULAR | Status: DC | PRN
  Administered 2019-06-12: 4 mg via INTRAVENOUS

## 2019-06-12 MED ORDER — MENTHOL 3 MG MT LOZG: 1.0000 | LOZENGE | OROMUCOSAL | Status: DC | PRN

## 2019-06-12 MED ORDER — PHENYLEPHRINE HCL-NACL 20-0.9 MG/250ML-% IV SOLN
INTRAVENOUS | Status: AC
  Filled 2019-06-12: qty 250

## 2019-06-12 MED ORDER — SODIUM CHLORIDE 0.9% FLUSH: 3.0000 mL | INTRAVENOUS | Status: DC | PRN

## 2019-06-12 MED ORDER — ENOXAPARIN SODIUM 40 MG/0.4ML ~~LOC~~ SOLN
40.0000 mg | SUBCUTANEOUS | Status: DC
  Administered 2019-06-13 – 2019-06-14 (×2): 40 mg via SUBCUTANEOUS
  Filled 2019-06-12 (×2): qty 0.4

## 2019-06-12 MED ORDER — DEXAMETHASONE SODIUM PHOSPHATE 10 MG/ML IJ SOLN
INTRAMUSCULAR | Status: DC | PRN
  Administered 2019-06-12: 10 mg via INTRAVENOUS

## 2019-06-12 MED ORDER — EPHEDRINE 5 MG/ML INJ: 10.0000 mg | INTRAVENOUS | Status: DC | PRN

## 2019-06-12 MED ORDER — OXYTOCIN BOLUS FROM INFUSION: 500.0000 mL | Freq: Once | INTRAVENOUS | Status: DC

## 2019-06-12 MED ORDER — OXYTOCIN 40 UNITS IN NORMAL SALINE INFUSION - SIMPLE MED
INTRAVENOUS | Status: DC | PRN
  Administered 2019-06-12: 40 [IU] via INTRAVENOUS

## 2019-06-12 MED ORDER — LACTATED RINGERS IV SOLN: 500.0000 mL | INTRAVENOUS | Status: DC | PRN

## 2019-06-12 MED ORDER — KETOROLAC TROMETHAMINE 30 MG/ML IJ SOLN
30.0000 mg | Freq: Once | INTRAMUSCULAR | Status: AC | PRN
  Administered 2019-06-12: 14:00:00 30 mg via INTRAVENOUS

## 2019-06-12 MED ORDER — NALBUPHINE HCL 10 MG/ML IJ SOLN
5.0000 mg | INTRAMUSCULAR | Status: DC | PRN
  Administered 2019-06-12: 18:00:00 5 mg via INTRAVENOUS

## 2019-06-12 MED ORDER — CEFAZOLIN SODIUM-DEXTROSE 2-4 GM/100ML-% IV SOLN: 2.0000 g | INTRAVENOUS | Status: DC

## 2019-06-12 MED ORDER — PNEUMOCOCCAL VAC POLYVALENT 25 MCG/0.5ML IJ INJ
0.5000 mL | INJECTION | INTRAMUSCULAR | Status: AC
  Administered 2019-06-13: 0.5 mL via INTRAMUSCULAR
  Filled 2019-06-12: qty 0.5

## 2019-06-12 MED ORDER — SODIUM CHLORIDE 0.9 % IV SOLN: INTRAVENOUS | Status: DC | PRN

## 2019-06-12 MED ORDER — BUPIVACAINE IN DEXTROSE 0.75-8.25 % IT SOLN
INTRATHECAL | Status: DC | PRN
  Administered 2019-06-12: 1.6 mL via INTRATHECAL

## 2019-06-12 MED ORDER — PRENATAL MULTIVITAMIN CH
1.0000 | ORAL_TABLET | Freq: Every day | ORAL | Status: DC
  Administered 2019-06-13: 12:00:00 1 via ORAL
  Filled 2019-06-12: qty 1

## 2019-06-12 MED ORDER — MORPHINE SULFATE (PF) 0.5 MG/ML IJ SOLN
INTRAMUSCULAR | Status: AC
  Filled 2019-06-12: qty 10

## 2019-06-12 MED ORDER — SENNOSIDES-DOCUSATE SODIUM 8.6-50 MG PO TABS
2.0000 | ORAL_TABLET | ORAL | Status: DC
  Administered 2019-06-13 (×2): 2 via ORAL
  Filled 2019-06-12 (×2): qty 2

## 2019-06-12 MED ORDER — ONDANSETRON HCL 4 MG/2ML IJ SOLN
4.0000 mg | Freq: Three times a day (TID) | INTRAMUSCULAR | Status: DC | PRN
  Administered 2019-06-12: 21:00:00 4 mg via INTRAVENOUS
  Filled 2019-06-12: qty 2

## 2019-06-12 MED ORDER — ONDANSETRON HCL 4 MG/2ML IJ SOLN: 4.0000 mg | Freq: Four times a day (QID) | INTRAMUSCULAR | Status: DC | PRN

## 2019-06-12 MED ORDER — SIMETHICONE 80 MG PO CHEW
80.0000 mg | CHEWABLE_TABLET | ORAL | Status: DC
  Administered 2019-06-13 (×2): 80 mg via ORAL
  Filled 2019-06-12 (×2): qty 1

## 2019-06-12 SURGICAL SUPPLY — 35 items
BENZOIN TINCTURE PRP APPL 2/3 (GAUZE/BANDAGES/DRESSINGS) ×3 IMPLANT
CHLORAPREP W/TINT 26ML (MISCELLANEOUS) ×3 IMPLANT
CLAMP CORD UMBIL (MISCELLANEOUS) IMPLANT
CLOSURE STERI STRIP 1/2 X4 (GAUZE/BANDAGES/DRESSINGS) ×2 IMPLANT
CLOSURE WOUND 1/2 X4 (GAUZE/BANDAGES/DRESSINGS) ×1
CLOTH BEACON ORANGE TIMEOUT ST (SAFETY) ×3 IMPLANT
DRSG OPSITE POSTOP 4X10 (GAUZE/BANDAGES/DRESSINGS) ×3 IMPLANT
ELECT REM PT RETURN 9FT ADLT (ELECTROSURGICAL) ×3
ELECTRODE REM PT RTRN 9FT ADLT (ELECTROSURGICAL) ×1 IMPLANT
EXTRACTOR VACUUM M CUP 4 TUBE (SUCTIONS) IMPLANT
EXTRACTOR VACUUM M CUP 4' TUBE (SUCTIONS)
GAUZE SPONGE 4X4 12PLY STRL LF (GAUZE/BANDAGES/DRESSINGS) ×6 IMPLANT
GLOVE BIOGEL PI IND STRL 7.0 (GLOVE) ×2 IMPLANT
GLOVE BIOGEL PI IND STRL 7.5 (GLOVE) ×2 IMPLANT
GLOVE BIOGEL PI INDICATOR 7.0 (GLOVE) ×4
GLOVE BIOGEL PI INDICATOR 7.5 (GLOVE) ×4
GLOVE ECLIPSE 7.5 STRL STRAW (GLOVE) ×3 IMPLANT
GOWN STRL REUS W/TWL LRG LVL3 (GOWN DISPOSABLE) ×9 IMPLANT
KIT ABG SYR 3ML LUER SLIP (SYRINGE) IMPLANT
NEEDLE HYPO 25X5/8 SAFETYGLIDE (NEEDLE) IMPLANT
NS IRRIG 1000ML POUR BTL (IV SOLUTION) ×3 IMPLANT
PACK C SECTION WH (CUSTOM PROCEDURE TRAY) ×3 IMPLANT
PAD ABD 7.5X8 STRL (GAUZE/BANDAGES/DRESSINGS) ×3 IMPLANT
PAD OB MATERNITY 4.3X12.25 (PERSONAL CARE ITEMS) ×3 IMPLANT
PENCIL SMOKE EVAC W/HOLSTER (ELECTROSURGICAL) ×3 IMPLANT
RTRCTR C-SECT PINK 25CM LRG (MISCELLANEOUS) ×3 IMPLANT
STRIP CLOSURE SKIN 1/2X4 (GAUZE/BANDAGES/DRESSINGS) ×2 IMPLANT
SUT VIC AB 0 CTX 36 (SUTURE) ×8
SUT VIC AB 0 CTX36XBRD ANBCTRL (SUTURE) ×4 IMPLANT
SUT VIC AB 2-0 CT1 27 (SUTURE) ×2
SUT VIC AB 2-0 CT1 TAPERPNT 27 (SUTURE) ×1 IMPLANT
SUT VIC AB 4-0 KS 27 (SUTURE) ×3 IMPLANT
TOWEL OR 17X24 6PK STRL BLUE (TOWEL DISPOSABLE) ×3 IMPLANT
TRAY FOLEY W/BAG SLVR 14FR LF (SET/KITS/TRAYS/PACK) ×3 IMPLANT
WATER STERILE IRR 1000ML POUR (IV SOLUTION) ×3 IMPLANT

## 2019-06-12 NOTE — Anesthesia Preprocedure Evaluation (Signed)
Anesthesia Evaluation  Patient identified by MRN, date of birth, ID band Patient awake    Reviewed: Allergy & Precautions, H&P , NPO status , Patient's Chart, lab work & pertinent test results, reviewed documented beta blocker date and time   History of Anesthesia Complications Negative for: history of anesthetic complications  Airway Mallampati: I  TM Distance: >3 FB Neck ROM: full    Dental  (+) Teeth Intact   Pulmonary Current Smoker,    breath sounds clear to auscultation       Cardiovascular negative cardio ROS   Rhythm:regular Rate:Normal     Neuro/Psych negative neurological ROS  negative psych ROS   GI/Hepatic negative GI ROS, Neg liver ROS,   Endo/Other  negative endocrine ROS  Renal/GU negative Renal ROS     Musculoskeletal   Abdominal   Peds  Hematology negative hematology ROS (+)   Anesthesia Other Findings   Reproductive/Obstetrics (+) Pregnancy                             Anesthesia Physical  Anesthesia Plan  ASA: II  Anesthesia Plan: Spinal   Post-op Pain Management:    Induction:   PONV Risk Score and Plan: 1 and Ondansetron and Treatment may vary due to age or medical condition  Airway Management Planned: Natural Airway  Additional Equipment:   Intra-op Plan:   Post-operative Plan:   Informed Consent: I have reviewed the patients History and Physical, chart, labs and discussed the procedure including the risks, benefits and alternatives for the proposed anesthesia with the patient or authorized representative who has indicated his/her understanding and acceptance.     Dental advisory given  Plan Discussed with: CRNA  Anesthesia Plan Comments:         Anesthesia Quick Evaluation

## 2019-06-12 NOTE — MAU Note (Signed)
CTX 3-54min apart since 0830.  Denies LOF or vaginal bleeding.  Reports good fetal movement.  Gets care at [facility].  Has hx of genital herpes, on valtrex, no current outbreak.

## 2019-06-12 NOTE — Anesthesia Postprocedure Evaluation (Signed)
Anesthesia Post Note  Patient: Megan Leon  Procedure(s) Performed: CESAREAN SECTION (N/A )     Patient location during evaluation: PACU Anesthesia Type: Spinal Level of consciousness: awake and alert Pain management: pain level controlled Vital Signs Assessment: post-procedure vital signs reviewed and stable Respiratory status: spontaneous breathing, nonlabored ventilation and respiratory function stable Cardiovascular status: blood pressure returned to baseline and stable Postop Assessment: no apparent nausea or vomiting Anesthetic complications: no    Last Vitals:  Vitals:   06/12/19 1505 06/12/19 1610  BP: 107/81 (P) 104/65  Pulse: (!) 56 (P) 64  Resp: 16 (P) 16  Temp:  (P) 36.7 C  SpO2: 100% (P) 99%    Last Pain:  Vitals:   06/12/19 1615  TempSrc:   PainSc: 0-No pain   Pain Goal: Patients Stated Pain Goal: 2 (06/12/19 1412)                 Lowella Curb

## 2019-06-12 NOTE — Discharge Summary (Addendum)
Postpartum Discharge Summary  Date of Service updated 06/14/2019     Patient Name: Megan Leon DOB: Nov 27, 1990 MRN: [id number]  Date of admission: 06/12/2019 Delivering Provider: Truett Mainland   Date of discharge: 06/14/2019  Admitting diagnosis: Indication for care in labor or delivery [O75.9] Intrauterine pregnancy: [redacted]w[redacted]d    Secondary diagnosis:  Active Problems:   Genital HSV   Smoker   Depression   Indication for care in labor or delivery   [redacted] weeks gestation of pregnancy  Additional problems: None     Discharge diagnosis: Term Pregnancy Delivered                                                                                                Post partum procedures:None  Augmentation: NA  Complications: None  Hospital course:  Onset of Labor With Unplanned C/S  29y.o. yo G2P1001 [place] 43w5das [place] and no ppx; unclear hx per patient. Membrane Rupture Time/Date: 12:54 PM ,06/12/2019   The patient went for cesarean section due to recent HSV outbreak not on ppx, and delivered a Viable infant,06/12/2019  Details of operation can be found in separate operative note. Patient had an uncomplicated postpartum course.  She is ambulating,tolerating a regular diet, passing flatus, and urinating well.  Patient is discharged home in stable condition 06/14/19 per her request for early d/c if baby is able to go. She elects to be seen by our group for her postpartum care; will [place] plan [place] [facility] [place] Delivery time: 12:55 PM    Magnesium Sulfate received: No BMZ received: No Rhophylac:N/A MMR:N/A Transfusion:No  Physical exam  Vitals:   06/13/19 0730 06/13/19 1451 06/13/19 2123 06/14/19 0602  BP: 113/65 (!) 94/51 113/77 (!) 97/51  Pulse: (!) 54 (!) 59 86 72  Resp: _0 Temp: 98.1 F (36.7 C) 98.4 F (36.9 C) 97.8 F (36.6 C) 98.3  F (36.8 C)  TempSrc:  Oral Oral Oral  SpO2:  99% 99%   Weight:       General: alert Lochia: appropriate Uterine Fundus: firm Incision: Healing well with no significant drainage DVT Evaluation: No evidence of DVT seen on physical exam. Negative Homan's sign. No cords or calf tenderness. No significant calf/ankle edema. Labs: Lab Results  Component Value Date   WBC 18.9 (H) 06/13/2019   HGB 10.1 (L) 06/13/2019   HCT 29.2 (L) 06/13/2019   MCV 95.1 06/13/2019   PLT 178 06/13/2019   CMP Latest Ref Rng & Units 06/12/2019  Glucose 65 - 99 mg/dL -  BUN 6 - 20 mg/dL -  Creatinine 0.44 - 1.00 mg/dL 0.60  Sodium 134 - 144 mmol/L -  Potassium 3.5 - 5.2 mmol/L -  Chloride 96 - 106 mmol/L -  CO2 20 - 29 mmol/L -  Calcium 8.7 - 10.2 mg/dL -  Total Protein 6.0 - 8.5 g/dL -  Total Bilirubin 0.0 - 1.2 mg/dL -  Alkaline Phos  39 - 117 IU/L -  AST 0 - 40 IU/L -  ALT 0 - 32 IU/L -   Edinburgh Score: Edinburgh Postnatal Depression Scale Screening Tool 06/12/2019  I have been able to laugh and see the funny side of things. 0  I have looked forward with enjoyment to things. 0  I have blamed myself unnecessarily when things went wrong. 0  I have been anxious or worried for no good reason. 0  I have felt scared or panicky for no good reason. 0  Things have been getting on top of me. 0  I have been so unhappy that I have had difficulty sleeping. 0  I have felt sad or miserable. 0  I have been so unhappy that I have been crying. 0  The thought of harming myself has occurred to me. 0  Edinburgh Postnatal Depression Scale Total 0    Discharge instruction: per After Visit Summary and "Baby and Me Booklet".  After visit meds:  Allergies as of 06/14/2019   No Known Allergies     Medication List    TAKE these medications   acetaminophen 325 MG tablet Commonly known as: Tylenol Take 2 tablets (650 mg total) by mouth every 6 (six) hours as needed.   fluticasone 50 MCG/ACT nasal  spray Commonly known as: FLONASE Place 2 sprays into both nostrils daily.   ibuprofen 800 MG tablet Commonly known as: ADVIL Take 1 tablet (800 mg total) by mouth every 8 (eight) hours.   oxyCODONE 5 MG immediate release tablet Commonly known as: Oxy IR/ROXICODONE Take 1 tablet (5 mg total) by mouth every 4 (four) hours as needed for up to 7 days for moderate pain or severe pain.   prenatal multivitamin Tabs tablet Take 1 tablet by mouth daily at 12 noon.   valACYclovir 500 MG tablet Commonly known as: VALTREX Take 500 mg by mouth 2 (two) times daily.       Diet: routine diet  Activity: Advance as tolerated. Pelvic rest for 6 weeks.   Outpatient follow up:1-2wk incision check; 4-6wk PP visit Follow up Appt: Future Appointments  Date Time Provider [place]  06/27/2019 10:20 AM [place]  06/27/2019 11:10 AM Myrtis Ser, CNM [facility]  07/09/2019 10:30 AM Christin Fudge, CNM [facility]  07/17/2019  3:55 PM Hillard Danker, Myles Rosenthal, PA-C [facility]   Follow up Visit: Follow-up Information    [facility] Follow up.   Why: You will get an incision check in 1-2 weeks and a postpartum visit in 4 weeks. Contact information: [place] [address] [phone]           Requested f/u at [place] [facility]. Please schedule this patient for Postpartum visit in: 4 weeks with the following provider: Any provider Virtual For C/S patients schedule nurse incision check in weeks 2 weeks: yes Low risk pregnancy complicated by: outside [facility]; HSV Delivery mode:  CS Anticipated Birth Control:  other/unsure PP Procedures needed: none  Schedule Integrated BH visit: no     Newborn Data: Live born female  Birth Weight: 3430g APGAR: 51, 9  Newborn Delivery   Birth date/time: 06/12/2019 12:55:00 Delivery type: C-Section, Low Transverse Trial of labor: No C-section categorization: Primary       Baby Feeding: Breast Disposition:home with mother   (originally signed by Glenna Durand DO)  CNM attestation I have seen and examined this patient and agree with above documentation in the resident's note.  Megan Leon is a 29 y.o. [phone] s/p pLTCS for active HSV outbreak.   Pain is well controlled.  Plan for birth control is unsure.  Method of Feeding: breast  PE:  BP (!) 97/51 (BP Location: Right Arm)   Pulse 72   Temp 98.3 F (36.8 C) (Oral)   Resp 16   Wt 64.3 kg   LMP 08/31/2018 (Exact Date)   SpO2 99%   Breastfeeding Unknown   BMI 26.79 kg/m  Fundus firm  Recent Labs    06/12/19 1135 06/13/19 0521  HGB 14.1 10.1*  HCT 41.3 29.2*     Plan: discharge today - postpartum care discussed - f/u clinic in 1-2wk incison check, then 4-6 weeks for postpartum visit   Myrtis Ser, CNM 8:55 PM  06/14/2019

## 2019-06-12 NOTE — Transfer of Care (Signed)
Immediate Anesthesia Transfer of Care Note  Patient: Megan Leon  Procedure(s) Performed: CESAREAN SECTION (N/A )  Patient Location: PACU  Anesthesia Type:Spinal  Level of Consciousness: awake  Airway & Oxygen Therapy: Patient Spontanous Breathing  Post-op Assessment: Report given to RN  Post vital signs: Reviewed and stable  Last Vitals:  Vitals Value Taken Time  BP    Temp    Pulse    Resp    SpO2      Last Pain:  Vitals:   06/12/19 1208  TempSrc:   PainSc: 8          Complications: No apparent anesthesia complications

## 2019-06-12 NOTE — Progress Notes (Signed)
Called to room due to concern from RN that patient reported a HSV outbreak <4 weeks ago. Patient initially reported no outbreaks throughout entire pregnancy but then said she had one sometime with the past month. No prodromal symptoms or current lesions now. Recent outbreak was on labia. Has been compliant with Valtrex throughout entire pregnancy.   Discussed risks of HSV encephalitis due to viral shedding with recent outbreak sometime within third trimester. Discussed risks of C-section. Patient elected for C-section as route of delivery. Have ordered Ancef for surgical prophylaxis.   Denies other problems with this pregnancy or problems with prior pregnancy/delivery. To OR when ready. Anesthesia and Dr. Adrian Blackwater aware. Full H&P to follow.   Marcy Siren, D.O. Primary Care at [facility]  06/12/2019, 12:21 PM

## 2019-06-12 NOTE — Anesthesia Procedure Notes (Signed)
Spinal  Patient location during procedure: OB Start time: 06/12/2019 12:31 PM End time: 06/12/2019 12:36 PM Staffing Performed: anesthesiologist  Anesthesiologist: Lowella Curb, MD Preanesthetic Checklist Completed: patient identified, IV checked, risks and benefits discussed, surgical consent, monitors and equipment checked, pre-op evaluation and timeout performed Spinal Block Patient position: sitting Prep: DuraPrep and site prepped and draped Patient monitoring: heart rate, cardiac monitor, continuous pulse ox and blood pressure Approach: midline Location: L3-4 Injection technique: single-shot Needle Needle type: Pencan  Needle gauge: 24 G Needle length: 10 cm Assessment Sensory level: T4

## 2019-06-12 NOTE — H&P (Signed)
LABOR AND DELIVERY ADMISSION HISTORY AND PHYSICAL NOTE  Megan Leon is a 29 y.o. female G2P1001 with IUP at [redacted]w[redacted]d by LMP and [place] presenting for SOL.  She reports positive fetal movement. She denies leakage of fluid or vaginal bleeding.  Prenatal History/Complications: PNC at [facility]  Pregnancy complications:  -h/o HSV, patient reports on prophylaxis entire pregnancy (chart review shows prophylaxis prescribed at 36w and patient initially had issues obtaining); ?recent outbreak <4 weeks ago per patient   Past Medical History: Past Medical History:  Diagnosis Date  . Herpes   . Medical history non-contributory   . No pertinent past medical history     Past Surgical History: Past Surgical History:  Procedure Laterality Date  . NO PAST SURGERIES      Obstetrical History: OB History    Gravida  2   Para  1   Term  1   Preterm      AB      Living  1     SAB      TAB      Ectopic      Multiple      Live Births  1           Social History: Social History   Socioeconomic History  . Marital status: Single    Spouse name: Not on file  . Number of children: 1  . Years of education: Not on file  . Highest education level: Not on file  Occupational History  . Not on file  Tobacco Use  . Smoking status: Current Every Day Smoker    Packs/day: 0.50    Years: 5.00    Pack years: 2.50    Types: Cigarettes  . Smokeless tobacco: Never Used  Substance and Sexual Activity  . Alcohol use: No  . Drug use: No  . Sexual activity: Yes    Birth control/protection: Pill  Other Topics Concern  . Not on file  Social History Narrative  . Not on file   Social Determinants of Health   Financial Resource Strain:   . Difficulty of Paying Living Expenses: Not on file  Food Insecurity:   . Worried About Programme researcher, broadcasting/film/video in the Last Year: Not on file  . Ran Out of Food in the Last Year: Not on file  Transportation Needs:   . Lack of Transportation (Medical): Not on  file  . Lack of Transportation (Non-Medical): Not on file  Physical Activity:   . Days of Exercise per Week: Not on file  . Minutes of Exercise per Session: Not on file  Stress:   . Feeling of Stress : Not on file  Social Connections:   . Frequency of Communication with Friends and Family: Not on file  . Frequency of Social Gatherings with Friends and Family: Not on file  . Attends Religious Services: Not on file  . Active Member of Clubs or Organizations: Not on file  . Attends Banker Meetings: Not on file  . Marital Status: Not on file    Family History: Family History  Problem Relation Age of Onset  . Hypertension Maternal Grandmother   . Diabetes Father   . Hypertension Father   . Anxiety disorder Sister     Allergies: No Known Allergies  Medications Prior to Admission  Medication Sig Dispense Refill Last Dose  . valACYclovir (VALTREX) 1000 MG tablet Take 1 tablet (1,000 mg total) by mouth 2 (two) times daily. 20 tablet 0 06/11/2019  at Unknown time  . acetaminophen (TYLENOL) 325 MG tablet Take 2 tablets (650 mg total) by mouth every 6 (six) hours as needed. 30 tablet 0 More than a month at Unknown time  . benzoyl peroxide (BENZOYL PEROXIDE) 5 % external liquid Apply topically 2 (two) times daily. 142 g 12 More than a month at Unknown time  . escitalopram (LEXAPRO) 20 MG tablet Take 1 tablet (20 mg total) by mouth daily. 30 tablet 2   . fluticasone (FLONASE) 50 MCG/ACT nasal spray Place 2 sprays into both nostrils daily. 16 g 6      Review of Systems  All systems reviewed and negative except as stated in HPI  Physical Exam Blood pressure 121/80, pulse 85, temperature 97.6 F (36.4 C), temperature source Oral, resp. rate 17, weight 64.3 kg, last menstrual period 08/31/2018, SpO2 99 %. General appearance: alert, oriented, NAD Lungs: normal respiratory effort Heart: regular rate Abdomen: soft, non-tender; gravid, FH appropriate for GA Extremities: No calf  swelling or tenderness Presentation: cephalic Fetal monitoring: Cat 1 tracind Uterine activity:  Dilation: 7 Effacement (%): 80 Station: -1 Exam by:: Leafy Ro, RN  Prenatal labs: ABO, Rh: --/--/PENDING (02/09 1135) Antibody: PENDING (02/09 1135) Rubella:  immune RPR:   Nonreactive HBsAg:   negative HIV:   nonreactive GC/Chlamydia: neg GBS:   negative 1hr GTT: negative Genetic screening:  Negative AFP Anatomy US: reported normal  Prenatal Transfer Tool  Maternal Diabetes: No Genetic Screening: Normal Maternal Ultrasounds/Referrals: Normal Fetal Ultrasounds or other Referrals:  None Maternal Substance Abuse:  No Significant Maternal Medications:   Significant Maternal Lab Results: Group B Strep negative and Other: HSV  Results for orders placed or performed during the hospital encounter of 06/12/19 (from the past 24 hour(s))  Respiratory Panel by RT PCR (Flu A&B, Covid) - Nasopharyngeal Swab   Collection Time: 06/12/19 11:33 AM   Specimen: Nasopharyngeal Swab  Result Value Ref Range   SARS Coronavirus 2 by RT PCR NEGATIVE NEGATIVE   Influenza A by PCR NEGATIVE NEGATIVE   Influenza B by PCR NEGATIVE NEGATIVE  CBC   Collection Time: 06/12/19 11:35 AM  Result Value Ref Range   WBC 16.7 (H) 4.0 - 10.5 K/uL   RBC 4.39 3.87 - 5.11 MIL/uL   Hemoglobin 14.1 12.0 - 15.0 g/dL   HCT 09.9 83.3 - 82.5 %   MCV 94.1 80.0 - 100.0 fL   MCH 32.1 26.0 - 34.0 pg   MCHC 34.1 30.0 - 36.0 g/dL   RDW 05.3 97.6 - 73.4 %   Platelets 248 150 - 400 K/uL   nRBC 0.0 0.0 - 0.2 %  Type and screen [facility]   Collection Time: 06/12/19 11:35 AM  Result Value Ref Range   ABO/RH(D) PENDING    Antibody Screen PENDING    Sample Expiration      06/15/2019,2359 Performed at [facility], [address], [place], [place] [address]     Patient Active Problem List   Diagnosis Date Noted  . Indication for care in labor or delivery 06/12/2019  . 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 Depression 02/01/2017  . Encounter for physical examination, contraception, and Papanicolaou smear of cervix 02/01/2017  . Smoker 08/21/2013  . Genital HSV 07/26/2012    Assessment: RUBI TOOLEY is a 29 y.o. G2P1001 at [redacted]w[redacted]d here for SOL. Reports active HSV outbreak < 4 weeks ago. Patient consented for cesarean delivery:  The risks of cesarean section  discussed with the patient included but were not limited to: bleeding which may require transfusion or reoperation; infection which may require antibiotics; injury to bowel, bladder, ureters or other surrounding organs; injury to the fetus; need for additional procedures including hysterectomy in the event of a life-threatening hemorrhage; placental abnormalities wth subsequent pregnancies, incisional problems, thromboembolic phenomenon and other postoperative/anesthesia complications. The patient concurred with the proposed plan, giving informed written consent for the procedure.   Patient has been NPO since earlier today, she will remain NPO for procedure. Anesthesia and OR aware.  Preoperative prophylactic Ancef ordered on call to the OR.  To OR when ready.  Truett Mainland, DO 06/12/2019 12:35 PM

## 2019-06-12 NOTE — Lactation Note (Signed)
This note was copied from a baby's chart. Lactation Consultation Note  Patient Name: Megan Leon HTDSK'A Date: 06/12/2019 Reason for consult: Initial assessment;1st time breastfeeding;Term;Other (Comment)(LC encouraged mom to call with feeding cues for LC assessment)  Baby is 4 hours old , has eaten x 2 15  And 30 mins, Latch score 9. presently asleep.  Per mom did not breast feed her 1st baby ( whom in 27 years old ),  And felt due to the the benefits of breast milk wanted to try with this baby.  LC reassured mom she is off to a good start so far.  LC reviewed the breast feeding goals in 24 hours of feeding the baby with feeding  Cues and 8-12 times.  Per mom + breast changes with pregnancy.  Per mom active with [facility] but has not been in contact with them in 3 months. LC mentioned to mom a mother has 6 months before she becomes inacfive and all she needs to do is call them.  Mom will need a hand pump prior to D/C.  LC provided the [facility] pamphlet with phone  numbers and made mom aware of the  [facility] healthyBaby[facility]com website for the virtual support groups.     Maternal Data Has patient been taught Hand Expression?: Yes(per mom the RN has shown me -) Does the patient have breastfeeding experience prior to this delivery?: No  Feeding ( Latch Score by the [facility] earlier )  Feeding Type: (baby asleep , not showing feeding cues)  LATCH Score Latch: Grasps breast easily, tongue down, lips flanged, rhythmical sucking.  Audible Swallowing: Spontaneous and intermittent  Type of Nipple: Everted at rest and after stimulation  Comfort (Breast/Nipple): Soft / non-tender  Hold (Positioning): Assistance needed to correctly position infant at breast and maintain latch.  LATCH Score: 9  Interventions Interventions: Breast feeding basics reviewed  Lactation Tools Discussed/Used WIC Program: Yes   Consult Status Consult Status: Follow-up Date: 06/12/19 Follow-up type:  In-patient    Megan Leon 06/12/2019, 5:20 PM

## 2019-06-12 NOTE — Op Note (Signed)
Megan Leon PROCEDURE DATE: 06/12/2019  PREOPERATIVE DIAGNOSES: Intrauterine pregnancy at [redacted]w[redacted]d weeks gestation; arrived in active labor with recent HSV infection  POSTOPERATIVE DIAGNOSES: The same  PROCEDURE: Primary Low Transverse Cesarean Section  SURGEON:  Dr. Candelaria Celeste - Primary Marcy Siren, DO - Fellow Jerilynn Birkenhead, MD - Fellow  ANESTHESIOLOGY TEAM: Anesthesiologist: Lowella Curb, MD CRNA: Algis Greenhouse, CRNA  INDICATIONS: Megan Leon is a 29 y.o. G2P1001 at [redacted]w[redacted]d here for cesarean section secondary to the indications listed under preoperative diagnoses; please see preoperative note for further details.  The risks of cesarean section were discussed with the patient including but were not limited to: bleeding which may require transfusion or reoperation; infection which may require antibiotics; injury to bowel, bladder, ureters or other surrounding organs; injury to the fetus; need for additional procedures including hysterectomy in the event of a life-threatening hemorrhage; placental abnormalities wth subsequent pregnancies, incisional problems, thromboembolic phenomenon and other postoperative/anesthesia complications.   The patient concurred with the proposed plan, giving informed written consent for the procedure.    FINDINGS:  Viable female infant in cephalic presentation. Light meconium-stained amniotic fluid.  Intact placenta, three vessel cord.  Normal uterus, fallopian tubes and ovaries bilaterally. APGAR (1 MIN): 9   APGAR (5 MINS): 9   APGAR (10 MINS):    ANESTHESIA: Spinal INTRAVENOUS FLUIDS: 2100 ml   ESTIMATED BLOOD LOSS: 281 ml URINE OUTPUT:  50 ml SPECIMENS: Placenta sent to L&D COMPLICATIONS: None immediate  PROCEDURE IN DETAIL:  The patient preoperatively received intravenous antibiotics and had sequential compression devices applied to her lower extremities.  She was then taken to the operating room where spinal anesthesia was administered  and was found to be adequate. She was then placed in a dorsal supine position with a leftward tilt, and prepped and draped in a sterile manner.  A foley catheter was placed into her bladder and attached to constant gravity.  After an adequate timeout was performed, a Pfannenstiel skin incision was made with scalpel and carried through to the underlying layer of fascia. The fascia was incised in the midline, and this incision was extended bilaterally bluntly. The rectus muscles were separated in the midline and the peritoneum was entered bluntly. The Alexis self-retaining retractor was introduced into the abdominal cavity.  Attention was turned to the lower uterine segment where a low transverse hysterotomy was made with a scalpel and extended bilaterally bluntly.  The infant was successfully delivered, the cord was clamped and cut after one minute, and the infant was handed over to the awaiting neonatology team. Uterine massage was then administered, and the placenta delivered intact with a three-vessel cord. The uterus was then cleared of clots and debris.  The hysterotomy was closed with 0 Vicryl in a running locked fashion, and an imbricating layer was also placed with 0 Vicryl. Figure-of-eight 0 Vicryl serosal stitches were placed to help with hemostasis.  The pelvis was cleared of all clot and debris. Hemostasis was confirmed on all surfaces.  The retractor was removed.  The peritoneum was closed with a 2-0 Vicryl running stitch. The fascia was then closed using 0 Vicryl in a running fashion. The skin was closed with a 4-0 Vicryl subcuticular stitch. The patient tolerated the procedure well. Sponge, instrument and needle counts were correct x 3.  She was taken to the recovery room in stable condition.   Jerilynn Birkenhead, MD [facility] Fellow, [facility] for [employer], [facility]

## 2019-06-13 LAB — CBC
HCT: 29.2 % — ABNORMAL LOW (ref 36.0–46.0)
Hemoglobin: 10.1 g/dL — ABNORMAL LOW (ref 12.0–15.0)
MCH: 32.9 pg (ref 26.0–34.0)
MCHC: 34.6 g/dL (ref 30.0–36.0)
MCV: 95.1 fL (ref 80.0–100.0)
Platelets: 178 10*3/uL (ref 150–400)
RBC: 3.07 MIL/uL — ABNORMAL LOW (ref 3.87–5.11)
RDW: 12.5 % (ref 11.5–15.5)
WBC: 18.9 10*3/uL — ABNORMAL HIGH (ref 4.0–10.5)
nRBC: 0 % (ref 0.0–0.2)

## 2019-06-13 LAB — RPR: RPR Ser Ql: NONREACTIVE

## 2019-06-13 MED ORDER — POLYSACCHARIDE IRON COMPLEX 150 MG PO CAPS
150.0000 mg | ORAL_CAPSULE | Freq: Every day | ORAL | Status: DC
  Administered 2019-06-13 – 2019-06-14 (×2): 150 mg via ORAL
  Filled 2019-06-13 (×2): qty 1

## 2019-06-13 MED ORDER — VALACYCLOVIR HCL 500 MG PO TABS
500.0000 mg | ORAL_TABLET | Freq: Every day | ORAL | Status: DC
  Administered 2019-06-13 – 2019-06-14 (×2): 500 mg via ORAL
  Filled 2019-06-13 (×2): qty 1

## 2019-06-13 MED ORDER — LACTATED RINGERS IV BOLUS
1000.0000 mL | Freq: Once | INTRAVENOUS | Status: AC
  Administered 2019-06-13: 1000 mL via INTRAVENOUS

## 2019-06-13 NOTE — Lactation Note (Signed)
This note was copied from a baby's chart. Lactation Consultation Note Baby 15 hrs old. Mom stated baby is cluster feeding wanting to eat every hour. LC demonstrated to mom hand expression w/mom's hands. Then mom demonstrated w/colostrum noted. Mom glad to see colostrum. Baby cueing. Gave baby to mom, encouraged STS, un-swaddle when feeding. Encouraged breast massage. Baby had good latch. Gave mom pillows for positioning and support. Praised mom. encouraged to call for assistance.  Patient Name: Megan Leon NOIBB'C Date: 06/13/2019 Reason for consult: Follow-up assessment;Term   Maternal Data Has patient been taught Hand Expression?: Yes  Feeding Feeding Type: Breast Fed  LATCH Score Latch: Grasps breast easily, tongue down, lips flanged, rhythmical sucking.  Audible Swallowing: A few with stimulation  Type of Nipple: Everted at rest and after stimulation  Comfort (Breast/Nipple): Soft / non-tender  Hold (Positioning): No assistance needed to correctly position infant at breast.  LATCH Score: 9  Interventions Interventions: Breast feeding basics reviewed;Support pillows;Skin to skin;Breast massage;Breast compression  Lactation Tools Discussed/Used     Consult Status Consult Status: Follow-up Date: 06/14/19 Follow-up type: In-patient    Charyl Dancer 06/13/2019, 4:35 AM

## 2019-06-13 NOTE — Progress Notes (Signed)
POSTPARTUM PROGRESS NOTE  Subjective: Megan Leon is a 29 y.o. [phone] with a hx of PLTCS (recent HSV outbreak) s/p LTCS PPD#1 at [redacted]w[redacted]d after presenting for SOL. She reports she is doing well. Yesterday after surgery, she complained of nausea and vomiting associated with eating. Symptoms gradually subsided overnight. Currently she denies nausea or vomiting and reports being able to keep food down. She began ambulating yesterday, but was limiting by associated abdominal pain. Pain is moderately controlled, currently a 6/10. Patient notes that she plans to increase ambulation today with hopes of achieving discharge as soon as safely possible. She denies any problems with voiding and has passed flatus. Lochia is normal.  Objective: Blood pressure 113/65, pulse (!) 54, temperature 98.1 F (36.7 C), resp. rate 20, weight 64.3 kg, last menstrual period 08/31/2018, SpO2 98 %, unknown if currently breastfeeding.  Physical Exam:  General: alert, cooperative and no distress Chest: no respiratory distress, breath sounds clear throughout, regular rate and rhythm Abdomen: soft, normal active bowel sounds, lower quadrants moderately tender to palpation Uterine Fundus: firm, appropriately tender Extremities: No calf swelling or tenderness  no pedal edema Skin: Low-transverse incision is covered with a wound dressing, no drainage or surrounding erythema   Recent Labs    06/12/19 1135 06/13/19 0521  HGB 14.1 10.1*  HCT 41.3 29.2*    Assessment/Plan: Megan Leon is a 29 y.o. G2P2002 hx of PLTCS (recent HSV outbreak) s/p LTCS PPD#1 at [redacted]w[redacted]d.   Routine Postpartum Care: Doing well, pain well-controlled.  -- Continue routine care, lactation support  -- Contraception: OCPs -- Feeding: Breast - Incentive spirometry - Pain control with scheduled tylenol/ibuprophen, and prn oxycodone  - Patient encouraged to continue ambulating today  HSV: Recent outbreak < 4 weeks ago. Pt reports on prophylaxis  throughout entire pregnancy, but chart review shows prophylaxis prescribed at 36w.  - Valtrex stopped postpartum given no further risk of vertical transmission, but can restart outpatient if outbreaks persist  Decreased hemoglobin: Down to 10.1 from 14.1 - Repeat CBCs - Start oral iron polysaccharide (versus ferrous sulfate given recent N/V symptoms)  Dispo: Plan for discharge on 2/11 or 2/12 if patient continues to improve without complications.  Awilda Covin, Ms3 06/13/2019, 7:54 AM

## 2019-06-13 NOTE — Progress Notes (Signed)
CSW received consult for hx of Anxiety and Depression.  CSW met with MOB to offer support and complete assessment.    CSW spoke with MOB at bedside to address further needs. Upon entering the room, CSW congratulated MOB on the birth of infant. CSW advised MOB Of CSW's role and the reason for CSW coming to visit with her. MOB reports that she was diagnosed with anxiety in 2015 and then diagnosed with depression during this pregnancy in 2020. MOB reported " I was dealing with a lot during this pregnancy, but now I feel fine". CSW asked MOB what she was dealing with and MOB just reported "stuff". CSW inquired from [facility] on if things have gotten better for her and if she was given any medications to assist with her depression. MOB reports that she was given medication however she never took the medications because "im not a really medication person". CSW understanding and asked MOB if she was seeing a therapist to assist with her depression at this time. MOB reports that she was in therapy in middle school but reports that she is no longer in anything and doesn't feel that she is in need of therapy at this time.  As CSW spoke with MOB, CSW and MOB observed that infant began to make a unusual noise as if infant was trying to spit up but unable to. MOB picked infant up and infant began to cry. Shortly after infant spit up a large amount of substance. MOB requested that CSW get NT or RN to assist her. NT entered the room and assessed infant as CSW finished assessing MOB. MOB reports that she has no other needs at this time and has all needed items to care for infant with no other concerns.   CSW provided education regarding the baby blues period vs. perinatal mood disorders, discussed treatment and gave resources for mental health follow up if concerns arise.  CSW recommends self-evaluation during the postpartum time period using the New Mom Checklist from Postpartum Progress and encouraged MOB to contact a medical  professional if symptoms are noted at any time.   CSW provided review of Sudden Infant Death Syndrome (SIDS) precautions.   CSW identifies no further need for intervention and no barriers to discharge at this time.   Virgie Dad Terrionna Bridwell, MSW, LCSW [facility] [phone]

## 2019-06-13 NOTE — Lactation Note (Signed)
This note was copied from a baby's chart. Lactation Consultation Note  Patient Name: Megan Leon VTXLE'[id number] Date: 06/13/2019 Reason for consult: Follow-up assessment;1st time breastfeeding;Term;Infant weight loss  Baby is 25 hours old  As LC entered the room mom updated the LC that the baby had recently fed for 45 mins with swallows. Per mom nipples are feeling alittle sore.  Baby acting still hungry and mom latched the baby using the cradle position and LC assisted to flipped the lips to flanged position and obtain better depth and alignment. More swallows noted when mom was doing compressions.  LC recommended trying the cross cradle to start and switch arms to the cradle once latched with depth.     Maternal Data    Feeding Feeding Type: Breast Fed  LATCH Score Latch: Grasps breast easily, tongue down, lips flanged, rhythmical sucking.  Audible Swallowing: A few with stimulation  Type of Nipple: Everted at rest and after stimulation  Comfort (Breast/Nipple): Soft / non-tender  Hold (Positioning): Assistance needed to correctly position infant at breast and maintain latch.  LATCH Score: 8  Interventions Interventions: Breast feeding basics reviewed;Assisted with latch;Skin to skin;Breast compression  Lactation Tools Discussed/Used     Consult Status Consult Status: Follow-up Date: 06/14/19 Follow-up type: In-patient    Megan Leon 06/13/2019, 2:53 PM

## 2019-06-14 MED ORDER — OXYCODONE HCL 5 MG PO TABS: 5.0000 mg | ORAL_TABLET | ORAL | 0 refills | Status: AC | PRN

## 2019-06-14 MED ORDER — IBUPROFEN 800 MG PO TABS: 800.0000 mg | ORAL_TABLET | Freq: Three times a day (TID) | ORAL | 0 refills | Status: DC

## 2019-06-14 MED ORDER — OXYCODONE HCL 5 MG PO TABS: 5.0000 mg | ORAL_TABLET | ORAL | 0 refills | Status: DC | PRN

## 2019-06-14 NOTE — Lactation Note (Signed)
This note was copied from a baby's chart. Lactation Consultation Note  Patient Name: Megan Leon MPNTI'R Date: 06/14/2019 Reason for consult: Follow-up assessment;1st time breastfeeding;Term;Infant weight loss;Other (Comment);Nipple pain/trauma(7 % weight loss - Correlates with voids and stools - LC reviewed doc flow sheets)  Baby is 45 hours old - 7 % weight loss / Bili- check at 41 hours 5.5  Baby asleep in the crib. Per mom the reason I started formula my nipples are sore.  Per mom breast are heavier and breast feel fuller. LC reassured mom that is a good sign. LC offered to assess sore nipples / mom receptive and LC noted positional strips on the upper portion of both nipples. LC suspects its from latching with the cradle position.  LC reviewed sore nipple and engorgement prevention and fx.  LC recommended prior to every latch until soreness has improved and cleared - breast massage, hand express, pre-pump to prime the milk ducts and latch working with baby to open wide. Feed on the breast that is less sore to give the sorer one a break and allow time to heal. Feed the 1st breast for 15 -20 mins / 30 mins max and supplement up to 1oz and pump the other breast .  LC assisted and instructed mom on the use of comfort gels X 6 days, alternating with breast shells while awake and with coconut oil ( a dab).  LC had worked with mom yesterday to obtain the depth.  Mom aware of the [email or web address] for virtual support groups, and the [facility] phone numbers for resources.     Maternal Data Has patient been taught Hand Expression?: Yes(LC reviewed - breast are filling warmer today . see LC note for assessment of sore nipples)  Feeding Feeding Type: (per mom baby last fed at 8:30 am) Nipple Type: Extra Slow Flow  LATCH Score                   Interventions Interventions: Breast feeding basics reviewed;Comfort gels;Hand pump;Shells;Coconut oil  Lactation Tools Discussed/Used Tools:  Shells;Pump;Flanges;Coconut oil;Comfort gels Flange Size: 24 Shell Type: Inverted Breast pump type: Manual Pump Review: Milk Storage;Setup, frequency, and cleaning(mom already had the hand pump and per mom the #24 F is comfortable) Initiated by:: MAI - reviewed   Consult Status Consult Status: Complete Date: 06/14/19    Kathrin Greathouse 06/14/2019, 10:27 AM

## 2019-06-14 NOTE — Discharge Instructions (Signed)
Cesarean Delivery, Care After This sheet gives you information about how to care for yourself after your procedure. Your health care provider may also give you more specific instructions. If you have problems or questions, contact your health care provider. What can I expect after the procedure? After the procedure, it is common to have:  A small amount of blood or clear fluid coming from the incision.  Some redness, swelling, and pain in your incision area.  Some abdominal pain and soreness.  Vaginal bleeding (lochia). Even though you did not have a vaginal delivery, you will still have vaginal bleeding and discharge.  Pelvic cramps.  Fatigue. You may have pain, swelling, and discomfort in the tissue between your vagina and your anus (perineum) if:  Your C-section was unplanned, and you were allowed to labor and push.  An incision was made in the area (episiotomy) or the tissue tore during attempted vaginal delivery. Follow these instructions at home: Incision care   Follow instructions from your health care provider about how to take care of your incision. Make sure you: ? Wash your hands with soap and water before you change your bandage (dressing). If soap and water are not available, use hand sanitizer. ? If you have a dressing, change it or remove it as told by your health care provider. ? Leave stitches (sutures), skin staples, skin glue, or adhesive strips in place. These skin closures may need to stay in place for 2 weeks or longer. If adhesive strip edges start to loosen and curl up, you may trim the loose edges. Do not remove adhesive strips completely unless your health care provider tells you to do that.  Check your incision area every day for signs of infection. Check for: ? More redness, swelling, or pain. ? More fluid or blood. ? Warmth. ? Pus or a bad smell.  Do not take baths, swim, or use a hot tub until your health care provider says it's okay. Ask your health  care provider if you can take showers.  When you cough or sneeze, hug a pillow. This helps with pain and decreases the chance of your incision opening up (dehiscing). Do this until your incision heals. Medicines  Take over-the-counter and prescription medicines only as told by your health care provider.  If you were prescribed an antibiotic medicine, take it as told by your health care provider. Do not stop taking the antibiotic even if you start to feel better.  Do not drive or use heavy machinery while taking prescription pain medicine. Lifestyle  Do not drink alcohol. This is especially important if you are breastfeeding or taking pain medicine.  Do not use any products that contain nicotine or tobacco, such as cigarettes, e-cigarettes, and chewing tobacco. If you need help quitting, ask your health care provider. Eating and drinking  Drink at least 8 eight-ounce glasses of water every day unless told not to by your health care provider. If you breastfeed, you may need to drink even more water.  Eat high-fiber foods every day. These foods may help prevent or relieve constipation. High-fiber foods include: ? Whole grain cereals and breads. ? Brown rice. ? Beans. ? Fresh fruits and vegetables. Activity   If possible, have someone help you care for your baby and help with household activities for at least a few days after you leave the hospital.  Return to your normal activities as told by your health care provider. Ask your health care provider what activities are safe for   you.  Rest as much as possible. Try to rest or take a nap while your baby is sleeping.  Do not lift anything that is heavier than 10 lbs (4.5 kg), or the limit that you were told, until your health care provider says that it is safe.  Talk with your health care provider about when you can engage in sexual activity. This may depend on your: ? Risk of infection. ? How fast you heal. ? Comfort and desire to  engage in sexual activity. General instructions  Do not use tampons or douches until your health care provider approves.  Wear loose, comfortable clothing and a supportive and well-fitting bra.  Keep your perineum clean and dry. Wipe from front to back when you use the toilet.  If you pass a blood clot, save it and call your health care provider to discuss. Do not flush blood clots down the toilet before you get instructions from your health care provider.  Keep all follow-up visits for you and your baby as told by your health care provider. This is important. Contact a health care provider if:  You have: ? A fever. ? Bad-smelling vaginal discharge. ? Pus or a bad smell coming from your incision. ? Difficulty or pain when urinating. ? A sudden increase or decrease in the frequency of your bowel movements. ? More redness, swelling, or pain around your incision. ? More fluid or blood coming from your incision. ? A rash. ? Nausea. ? Little or no interest in activities you used to enjoy. ? Questions about caring for yourself or your baby.  Your incision feels warm to the touch.  Your breasts turn red or become painful or hard.  You feel unusually sad or worried.  You vomit.  You pass a blood clot from your vagina.  You urinate more than usual.  You are dizzy or light-headed. Get help right away if:  You have: ? Pain that does not go away or get better with medicine. ? Chest pain. ? Difficulty breathing. ? Blurred vision or spots in your vision. ? Thoughts about hurting yourself or your baby. ? New pain in your abdomen or in one of your legs. ? A severe headache.  You faint.  You bleed from your vagina so much that you fill more than one sanitary pad in one hour. Bleeding should not be heavier than your heaviest period. Summary  After the procedure, it is common to have pain at your incision site, abdominal cramping, and slight bleeding from your vagina.  Check  your incision area every day for signs of infection.  Tell your health care provider about any unusual symptoms.  Keep all follow-up visits for you and your baby as told by your health care provider. This information is not intended to replace advice given to you by your health care provider. Make sure you discuss any questions you have with your health care provider. Document Revised: 10/26/2017 Document Reviewed: 10/26/2017 Elsevier Patient Education  2020 [employer] Inc[employer]  

## 2019-06-27 ENCOUNTER — Other Ambulatory Visit: Payer: Self-pay

## 2019-06-27 ENCOUNTER — Ambulatory Visit (INDEPENDENT_AMBULATORY_CARE_PROVIDER_SITE_OTHER): Payer: Medicaid Other | Admitting: Advanced Practice Midwife

## 2019-06-27 ENCOUNTER — Encounter: Payer: Self-pay | Admitting: Advanced Practice Midwife

## 2019-06-27 ENCOUNTER — Ambulatory Visit: Payer: Medicaid Other

## 2019-06-27 VITALS — BP 129/83 | Ht 61.0 in | Wt 126.6 lb

## 2019-06-27 DIAGNOSIS — O34211 Maternal care for low transverse scar from previous cesarean delivery: Secondary | ICD-10-CM

## 2019-06-27 DIAGNOSIS — Z4889 Encounter for other specified surgical aftercare: Secondary | ICD-10-CM

## 2019-06-27 NOTE — Progress Notes (Signed)
POSTPARTUM VISIT Patient name: Megan Leon MRN [id number]  Date of birth: June 20, 1990 Chief Complaint:   Wound Check  History of Present Illness:   Megan Leon is a 29 y.o. G58P2002 Caucasian female being seen today for a postpartum visit. She is 2 weeks postpartum following a primary cesarean section, low transverse incision for active HSV outbreak at 40.5 gestational weeks. Anesthesia: spinal. Laceration: n/a. I have fully reviewed the prenatal and intrapartum course. Postpartum course has been uncomplicated. Bleeding thin lochia. Bowel function is normal. Bladder function is normal.  Patient is not sexually active.   Contraception method is desires Nexplanon insertion.    Edinburgh Postpartum Depression Screening: negative Edinburgh Postnatal Depression Scale - 06/27/19 1120      Edinburgh Postnatal Depression Scale:  In the Past 7 Days   I have been able to laugh and see the funny side of things.  0    I have looked forward with enjoyment to things.  0    I have blamed myself unnecessarily when things went wrong.  0    I have been anxious or worried for no good reason.  0    I have felt scared or panicky for no good reason.  0    Things have been getting on top of me.  0    I have been so unhappy that I have had difficulty sleeping.  0    I have felt sad or miserable.  0    I have been so unhappy that I have been crying.  0    The thought of harming myself has occurred to me.  0    Edinburgh Postnatal Depression Scale Total  0      Review of Systems:   Pertinent items are noted in HPI Denies Abnormal vaginal discharge w/ itching/odor/irritation, headaches, visual changes, shortness of breath, chest pain, abdominal pain, severe nausea/vomiting, or problems with urination or bowel movements. Pertinent History Reviewed:  Reviewed past medical,surgical, obstetrical and family history.  Reviewed problem list, medications and allergies. OB History  Gravida Para Term Preterm AB  Living  2 2 2     2   SAB TAB Ectopic Multiple Live Births        0 2    # Outcome Date GA Lbr Len/2nd Weight Sex Delivery Anes PTL Lv  2 Term 06/12/19 [redacted]w[redacted]d  7 lb 9 oz (3.43 kg) F CS-LTranv Spinal  LIV  1 Term 01/04/13 [redacted]w[redacted]d 08:35 / 03:56 7 lb 10.8 oz (3.48 kg) M Vag-Vacuum EPI  LIV   Physical Assessment:   Vitals:   06/27/19 1117  BP: 129/83  Weight: 126 lb 9.6 oz (57.4 kg)  Height: 5\' 1"  (1.549 m)  Body mass index is 23.92 kg/m.       Physical Examination:   General appearance: alert, well appearing, and in no distress  Mental status: alert, oriented to person, place, and time  Skin: warm & dry   Cardiovascular: normal heart rate noted   Respiratory: normal respiratory effort, no distress   Breasts: deferred, no complaints   Abdomen: soft, slightly tender; LTCS incision initially with honeycomb intact- removed; steri strips in place- one edge strip removed to visualize corner on incision- healing well   Pelvic: examination not indicated  Extremities: no edema       No results found for this or any previous visit (from the past 24 hour(s)).  Assessment & Plan:  1) Postpartum exam- C/S incision wound check 2) 2 wks  s/p pLTCS 3) Breast and bottlefeeding 4) Depression screening- neg 5) Contraception counseling, pt prefers Nexplanon  Meds: No orders of the defined types were placed in this encounter.   Follow-up: Return for 2-3wk Nexplanon insertion.- no sex  No orders of the defined types were placed in this encounter.   Myrtis Ser CNM 06/27/2019 12:56 PM

## 2019-07-03 ENCOUNTER — Encounter: Payer: Self-pay | Admitting: Advanced Practice Midwife

## 2019-07-03 ENCOUNTER — Encounter: Payer: Self-pay | Admitting: Family Medicine

## 2019-07-05 ENCOUNTER — Encounter: Payer: Self-pay | Admitting: Advanced Practice Midwife

## 2019-07-09 ENCOUNTER — Telehealth: Payer: Medicaid Other | Admitting: Advanced Practice Midwife

## 2019-07-12 ENCOUNTER — Ambulatory Visit (INDEPENDENT_AMBULATORY_CARE_PROVIDER_SITE_OTHER): Payer: Medicaid Other | Admitting: Advanced Practice Midwife

## 2019-07-12 ENCOUNTER — Encounter: Payer: Self-pay | Admitting: Advanced Practice Midwife

## 2019-07-12 ENCOUNTER — Other Ambulatory Visit: Payer: Self-pay

## 2019-07-12 VITALS — BP 116/84 | HR 89 | Ht 61.0 in | Wt 128.0 lb

## 2019-07-12 DIAGNOSIS — Z30017 Encounter for initial prescription of implantable subdermal contraceptive: Secondary | ICD-10-CM | POA: Diagnosis not present

## 2019-07-12 DIAGNOSIS — Z3202 Encounter for pregnancy test, result negative: Secondary | ICD-10-CM

## 2019-07-12 LAB — POCT URINE PREGNANCY: Preg Test, Ur: NEGATIVE

## 2019-07-12 MED ORDER — ETONOGESTREL 68 MG ~~LOC~~ IMPL
68.0000 mg | DRUG_IMPLANT | Freq: Once | SUBCUTANEOUS | Status: AC
  Administered 2019-07-12: 68 mg via SUBCUTANEOUS

## 2019-07-12 NOTE — Progress Notes (Signed)
  HPI:  Megan Leon is a 29 y.o. year old Caucasian female here for Nexplanon insertion.  She is 4 weeks postpartum , and her pregnancy test today was negative.  Risks/benefits/side effects of Nexplanon have been discussed and her questions have been answered.  Specifically, a failure rate of 05/998 has been reported, with an increased failure rate if pt takes St. John's Wort and/or antiseizure medicaitons.  TEAH VOTAW is aware of the common side effect of irregular bleeding, which the incidence of decreases over time.   Past Medical History: Past Medical History:  Diagnosis Date  . Herpes   . Medical history non-contributory   . No pertinent past medical history     Past Surgical History: Past Surgical History:  Procedure Laterality Date  . CESAREAN SECTION N/A 06/12/2019   Procedure: CESAREAN SECTION;  Surgeon: Levie Heritage, DO;  Location: [facility] LD ORS;  Service: Obstetrics;  Laterality: N/A;  . NO PAST SURGERIES      Family History: Family History  Problem Relation Age of Onset  . Hypertension Maternal Grandmother   . Diabetes Father   . Hypertension Father   . Anxiety disorder Sister     Social History: Social History   Tobacco Use  . Smoking status: Former Smoker    Packs/day: 0.50    Years: 5.00    Pack years: 2.50    Types: Cigarettes  . Smokeless tobacco: Never Used  Substance Use Topics  . Alcohol use: No  . Drug use: No    Allergies: No Known Allergies    Her left arm, approximatly 4 inches proximal from the elbow, was cleansed with alcohol and anesthetized with 2cc of 2% Lidocaine.  The area was cleansed again and the Nexplanon was inserted without difficulty.  A pressure bandage was applied.  Pt was instructed to remove pressure bandage in a few hours, and keep insertion site covered with a bandaid for 3 days.  Back up contraception was recommended for 1 week.  Follow-up scheduled PRN problems  Jacklyn Shell 07/12/2019 2:35 PM

## 2019-07-12 NOTE — Patient Instructions (Signed)

## 2019-07-17 ENCOUNTER — Telehealth: Payer: Medicaid Other | Admitting: Medical

## 2019-07-23 ENCOUNTER — Other Ambulatory Visit: Payer: Self-pay | Admitting: Advanced Practice Midwife

## 2019-07-23 MED ORDER — MEGESTROL ACETATE 40 MG PO TABS: ORAL_TABLET | ORAL | 3 refills | Status: DC

## 2019-07-23 NOTE — Progress Notes (Signed)
Megace for BTB/nexplaon

## 2019-07-26 ENCOUNTER — Encounter: Payer: Self-pay | Admitting: Advanced Practice Midwife

## 2019-07-26 ENCOUNTER — Other Ambulatory Visit: Payer: Self-pay

## 2019-07-26 ENCOUNTER — Telehealth (INDEPENDENT_AMBULATORY_CARE_PROVIDER_SITE_OTHER): Payer: Medicaid Other | Admitting: Advanced Practice Midwife

## 2019-07-26 NOTE — Progress Notes (Signed)
TELEHEALTH VIRTUAL POSTPARTUM VISIT ENCOUNTER NOTE  I connected with@ on 07/26/19 at  1:50 PM EDT by telephone at home and verified that I am speaking with the correct person using two identifiers.   I discussed the limitations, risks, security and privacy concerns of performing an evaluation and management service by telephone and the availability of in person appointments. I also discussed with the patient that there may be a patient responsible charge related to this service. The patient expressed understanding and agreed to proceed.  Appointment Date: 07/26/2019  OBGYN Clinic: [facility]  Chief Complaint:  Postpartum Visit  History of Present Illness: CORYNN SOLBERG is a 29 y.o.  [phone] (Patient's last menstrual period was 07/02/2019 (exact date).), seen for the above chief complaint.    She is s/p primary cesarean section on 06/12/19 at 40.5 weeks; she was discharged to home on POD#3. Pregnancy complicated by HSV outbreak <4 weeks from time of labor. Baby is doing wel.  Complains of nothing  Vaginal bleeding or discharge: No  Mode of feeding infant: Bottle Intercourse: Yes  Contraception: Neplanon placed last week PP depression s/s: No .  Any bowel or bladder issues: No  Pap smear: no abnormalities (date: 10/18)  Review of Systems: Positive for nothing. Her 12 point review of systems is negative or as noted in the History of Present Illness.  Patient Active Problem List   Diagnosis Date Noted  . Genital HSV 07/26/2012  . Nexplanon insertion 07/12/2019  . Herpes simplex vulvovaginitis 02/21/2019  . 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 Depression 02/01/2017  . Encounter for physical examination, contraception, and Papanicolaou smear of cervix 02/01/2017  . Smoker 08/21/2013    Medications Maud Deed. Sieler had no medications administered during this visit. Current Outpatient Medications  Medication Sig Dispense Refill  .  acetaminophen (TYLENOL) 325 MG tablet Take 2 tablets (650 mg total) by mouth every 6 (six) hours as needed. 30 tablet 0  . megestrol (MEGACE) 40 MG tablet Take 3/day (at the same time) for 5 days; 2/day for 5 days, then 1/day PO prn bleeding 60 tablet 3  . valACYclovir (VALTREX) 500 MG tablet Take 500 mg by mouth 2 (two) times daily.     No current facility-administered medications for this visit.    Allergies Patient has no known allergies.  Physical Exam:  General:  Alert, oriented and cooperative.   Mental Status: Normal mood and affect perceived. Normal judgment and thought content.  Rest of physical exam deferred due to type of encounter Incision looks well healed  PP Depression Screening:   Edinburgh Postnatal Depression Scale - 07/26/19 1336      Edinburgh Postnatal Depression Scale:  In the Past 7 Days   I have been able to laugh and see the funny side of things.  3    I have looked forward with enjoyment to things.  0    I have blamed myself unnecessarily when things went wrong.  0    I have been anxious or worried for no good reason.  0    I have felt scared or panicky for no good reason.  0    Things have been getting on top of me.  0    I have been so unhappy that I have had difficulty sleeping.  0    I have felt sad or miserable.  0    I have been so unhappy that I have been crying.  0  The thought of harming myself has occurred to me.  0    Edinburgh Postnatal Depression Scale Total  3       Assessment:Patient is a 29 y.o. [id number] who is 6 weeks postpartum from a primary cesarean section.  She is doing well.   Plan: Pap at end of October  I discussed the assessment and treatment plan with the patient. The patient was provided an opportunity to ask questions and all were answered. The patient agreed with the plan and demonstrated an understanding of the instructions.   The patient was advised to call back or seek an in-person evaluation/go to the ED for any  concerning postpartum symptoms.  I provided 10 minutes of non-face-to-face time during this encounter.   Jacklyn Shell, CNM [facility] [employer], [facility]

## 2019-07-31 ENCOUNTER — Encounter: Payer: Self-pay | Admitting: Adult Health

## 2019-07-31 ENCOUNTER — Ambulatory Visit (INDEPENDENT_AMBULATORY_CARE_PROVIDER_SITE_OTHER): Payer: Medicaid Other | Admitting: Adult Health

## 2019-07-31 ENCOUNTER — Other Ambulatory Visit: Payer: Self-pay

## 2019-07-31 VITALS — BP 107/73 | HR 96 | Ht 61.0 in | Wt 129.0 lb

## 2019-07-31 DIAGNOSIS — Z3202 Encounter for pregnancy test, result negative: Secondary | ICD-10-CM | POA: Diagnosis not present

## 2019-07-31 DIAGNOSIS — Z3046 Encounter for surveillance of implantable subdermal contraceptive: Secondary | ICD-10-CM | POA: Diagnosis not present

## 2019-07-31 DIAGNOSIS — Z30011 Encounter for initial prescription of contraceptive pills: Secondary | ICD-10-CM | POA: Insufficient documentation

## 2019-07-31 LAB — POCT URINE PREGNANCY: Preg Test, Ur: NEGATIVE

## 2019-07-31 MED ORDER — LEVONORGEST-ETH ESTRAD 91-DAY 0.15-0.03 &0.01 MG PO TABS: 1.0000 | ORAL_TABLET | Freq: Every day | ORAL | 4 refills | Status: DC

## 2019-07-31 NOTE — Patient Instructions (Signed)
Use condoms x4 weeks, keep clean and dry x 24 hours, no heavy lifting, keep steri strips on x 72 hours, Keep pressure dressing on x 24 hours. Follow up prn problems. Start OCs today Follow up in 3 months  

## 2019-07-31 NOTE — Progress Notes (Signed)
  Subjective:     Patient ID: Megan Leon, female   DOB: 10-04-1990, 29 y.o.   MRN: [id number]  HPI Megan Leon is a 29 year old white female,single, G2P2 in for nexplanon removal, is having bleeding and megace did not stop it, wants to start OCs. PCP is Dr Jana Hakim.  Review of Systems Bleeding with nexplanon Reviewed past medical,surgical, social and family history. Reviewed medications and allergies.     Objective:   Physical Exam BP 107/73 (BP Location: Left Arm, Patient Position: Sitting, Cuff Size: Normal)   Pulse 96   Ht 5\' 1"  (1.549 m)   Wt 129 lb (58.5 kg)   LMP 07/02/2019 (Exact Date)   Breastfeeding No   BMI 24.37 kg/m UPT is negative. Consent signed and time out called. Left arm cleansed with betadine, and injected with 1.5 cc 1% lidocaine and waited til numb.Under sterile technique a #11 blade was used to make small vertical incision, and fingers was used to easily remove rod. Steri strips applied. Pressure dressing applied.   Alcohol audit is 0 Fall risk is low PHQ 9 score is 0.   Assessment:     1. Pregnancy examination or test, negative result  2. Encounter for Nexplanon removal Use condoms x 4 weeks, keep clean and dry x 24 hours, no heavy lifting, keep steri strips on x 72 hours, Keep pressure dressing on x 24 hours. Follow up prn problems.  3. Encounter for initial prescription of contraceptive pills Start OCs today and use condoms for 4 weeks, she requests period every 3 months Meds ordered this encounter  Medications  . Levonorgestrel-Ethinyl Estradiol (AMETHIA) 0.15-0.03 &0.01 MG tablet    Sig: Take 1 tablet by mouth daily.    Dispense:  1 Package    Refill:  4    Order Specific Question:   Supervising Provider    Answer:   11-08-1996 [[id number]]      Plan:     Follow up in 3 months

## 2019-08-01 ENCOUNTER — Other Ambulatory Visit: Payer: Self-pay | Admitting: Adult Health

## 2019-08-01 MED ORDER — LEVONORGEST-ETH ESTRAD 91-DAY 0.15-0.03 &0.01 MG PO TABS: 1.0000 | ORAL_TABLET | Freq: Every day | ORAL | 4 refills | Status: AC

## 2019-08-01 NOTE — Progress Notes (Signed)
Will send rx to [facility] for OCs

## 2019-08-21 ENCOUNTER — Ambulatory Visit: Payer: Medicaid Other | Admitting: Family Medicine

## 2019-08-21 NOTE — Progress Notes (Deleted)
Subjective: CC: knee pain PCP: Raliegh Ip, DO EPP:IRJJOA Megan Leon is a 29 y.o. female presenting to clinic today for:  1. Knee pain ***   ROS: Per HPI  No Known Allergies Past Medical History:  Diagnosis Date  . Herpes   . Medical history non-contributory   . No pertinent past medical history     Current Outpatient Medications:  .  acetaminophen (TYLENOL) 325 MG tablet, Take 2 tablets (650 mg total) by mouth every 6 (six) hours as needed., Disp: 30 tablet, Rfl: 0 .  Levonorgestrel-Ethinyl Estradiol (AMETHIA) 0.15-0.03 &0.01 MG tablet, Take 1 tablet by mouth daily., Disp: 1 Package, Rfl: 4 .  valACYclovir (VALTREX) 500 MG tablet, Take 500 mg by mouth 2 (two) times daily., Disp: , Rfl:  Social History   Socioeconomic History  . Marital status: Single    Spouse name: Not on file  . Number of children: 1  . Years of education: Not on file  . Highest education level: Not on file  Occupational History  . Not on file  Tobacco Use  . Smoking status: Former Smoker    Packs/day: 0.50    Years: 5.00    Pack years: 2.50    Types: Cigarettes  . Smokeless tobacco: Never Used  Substance and Sexual Activity  . Alcohol use: No  . Drug use: No  . Sexual activity: Yes    Birth control/protection: Implant  Other Topics Concern  . Not on file  Social History Narrative  . Not on file   Social Determinants of Health   Financial Resource Strain: Low Risk   . Difficulty of Paying Living Expenses: Not very hard  Food Insecurity: No Food Insecurity  . Worried About Programme researcher, broadcasting/film/video in the Last Year: Never true  . Ran Out of Food in the Last Year: Never true  Transportation Needs: No Transportation Needs  . Lack of Transportation (Medical): No  . Lack of Transportation (Non-Medical): No  Physical Activity: Inactive  . Days of Exercise per Week: 0 days  . Minutes of Exercise per Session: 0 min  Stress: No Stress Concern Present  . Feeling of Stress : Not at all    Social Connections: Moderately Isolated  . Frequency of Communication with Friends and Family: More than three times a week  . Frequency of Social Gatherings with Friends and Family: Never  . Attends Religious Services: Never  . Active Member of Clubs or Organizations: No  . Attends Banker Meetings: Never  . Marital Status: Divorced  Catering manager Violence: Not At Risk  . Fear of Current or Ex-Partner: No  . Emotionally Abused: No  . Physically Abused: No  . Sexually Abused: No   Family History  Problem Relation Age of Onset  . Hypertension Maternal Grandmother   . Diabetes Father   . Hypertension Father   . Anxiety disorder Sister     Objective: Office vital signs reviewed. There were no vitals taken for this visit.  Physical Examination:  General: Awake, alert, *** nourished, No acute distress HEENT: Normal    Neck: No masses palpated. No lymphadenopathy    Ears: Tympanic membranes intact, normal light reflex, no erythema, no bulging    Eyes: PERRLA, extraocular membranes intact, sclera ***    Nose: nasal turbinates moist, *** nasal discharge    Throat: moist mucus membranes, no erythema, *** tonsillar exudate.  Airway is patent Cardio: regular rate and rhythm, S1S2 heard, no murmurs appreciated Pulm: clear  to auscultation bilaterally, no wheezes, rhonchi or rales; normal work of breathing on room air GI: soft, non-tender, non-distended, bowel sounds present x4, no hepatomegaly, no splenomegaly, no masses GU: external vaginal tissue ***, cervix ***, *** punctate lesions on cervix appreciated, *** discharge from cervical os, *** bleeding, *** cervical motion tenderness, *** abdominal/ adnexal masses Extremities: warm, well perfused, No edema, cyanosis or clubbing; +*** pulses bilaterally MSK: *** gait and *** station Skin: dry; intact; no rashes or lesions Neuro: *** Strength and light touch sensation grossly intact, *** DTRs ***/4  Assessment/ Plan: 29  y.o. female   ***  No orders of the defined types were placed in this encounter.  No orders of the defined types were placed in this encounter.    Janora Norlander, DO [place] [phone]

## 2019-08-23 ENCOUNTER — Encounter: Payer: Self-pay | Admitting: Family Medicine

## 2019-08-31 ENCOUNTER — Telehealth: Payer: Medicaid Other | Admitting: Physician Assistant

## 2019-08-31 DIAGNOSIS — R39198 Other difficulties with micturition: Secondary | ICD-10-CM

## 2019-08-31 NOTE — Progress Notes (Signed)
Based on what you shared with me, I feel your condition warrants further evaluation and I recommend that you be seen for a face to face office visit.  With your complaints of back/kidney pain and difficulty urinating, I would be concerned with the possibility of a kidney stone versus a kidney infection. Both conditions warrant obtaining a urine sample and urine culture, and if there is enough concern for a stone, possibly imaging such as a CT scan or ultrasound. I would recommend seeking evaluation today either at your PCP's office, local urgent care, or emergency department for further workup.    NOTE: If you entered your credit card information for this eVisit, you will not be charged. You may see a "hold" on your card for the $35 but that hold will drop off and you will not have a charge processed.   If you are having a true medical emergency please call 911.      For an urgent face to face visit,  has five urgent care centers for your convenience:      NEW:  [facility] at [facility] Directions [phone] [address] [place], [place] [address] . 10 am - 6pm Monday - Friday    [facility]) Get Driving Directions [phone] [address] [place], [place] [address] . 10 am to 8 pm Monday-Friday . 12 pm to 8 pm [facility]gent Care at [facility] Get Driving Directions [phone] [address] [place], [place] [address] . 8 am to 8 pm Monday-Friday . 9 am to 6 pm Saturday . 11 am to 6 pm Sunday     [facility] at [facility] Get Driving Directions  [phone] [address] [place], [place] [address] . 8 am to 8 pm Monday-Friday . 8 am to 4 pm [facility] Urgent Care at [facility] Directions [phone] [address] [place], [place] [address] . 12 pm to 6 pm  Monday-Friday      Your e-visit answers were reviewed by a board certified advanced clinical practitioner to complete your personal care plan.  Thank you for using e-Visits.   Greater than 5 minutes, yet less than 10 minutes of time have been spent researching, coordinating, and implementing care for this patient today.

## 2019-09-18 ENCOUNTER — Other Ambulatory Visit: Payer: Self-pay | Admitting: Adult Health

## 2019-09-18 MED ORDER — VALACYCLOVIR HCL 500 MG PO TABS: 500.0000 mg | ORAL_TABLET | Freq: Two times a day (BID) | ORAL | 12 refills | Status: DC

## 2019-09-18 NOTE — Progress Notes (Signed)
Refilled valtrex  

## 2019-10-31 ENCOUNTER — Ambulatory Visit: Payer: Medicaid Other | Admitting: Adult Health

## 2019-12-18 ENCOUNTER — Telehealth: Payer: Medicaid Other | Admitting: Physician Assistant

## 2019-12-18 DIAGNOSIS — R0981 Nasal congestion: Secondary | ICD-10-CM

## 2019-12-18 DIAGNOSIS — R519 Headache, unspecified: Secondary | ICD-10-CM

## 2019-12-18 MED ORDER — NAPROXEN 500 MG PO TABS: 500.0000 mg | ORAL_TABLET | Freq: Two times a day (BID) | ORAL | 0 refills | Status: AC

## 2019-12-18 MED ORDER — IPRATROPIUM BROMIDE 0.06 % NA SOLN: 2.0000 | Freq: Four times a day (QID) | NASAL | 12 refills | Status: AC

## 2019-12-18 NOTE — Progress Notes (Signed)
We are sorry that you are not feeling well.  Here is how we plan to help!  Based on what you have shared with me it looks like you have sinusitis.  Sinusitis is inflammation and infection in the sinus cavities of the head.  Based on your presentation I believe you most likely have Acute Viral Sinusitis. This is an infection most likely caused by a virus.   I suspect this is causing your headache.  I think switching your nasal decongestant may help.  Additionally, utilizing some antiinflammatories will help with your headache.  If this does not help, please plan for a face-to-face evaluation of your headache.  There is not specific treatment for viral sinusitis other than to help you with the symptoms until the infection runs its course.  You may use an oral decongestant such as Mucinex D or if you have glaucoma or high blood pressure use plain Mucinex. Saline nasal spray help and can safely be used as often as needed for congestion, I have prescribed: Ipratropium Bromide nasal spray 0.03% 2 sprays in eah nostril 2-3 times a day. Naprosyn 500mg  2x  per day for 5 days.    Some authorities believe that zinc sprays or the use of Echinacea may shorten the course of your symptoms.  Sinus infections are not as easily transmitted as other respiratory infection, however we still recommend that you avoid close contact with loved ones, especially the very young and elderly.  Remember to wash your hands thoroughly throughout the day as this is the number one way to prevent the spread of infection!  Home Care:  Only take medications as instructed by your medical team.  Do not take these medications with alcohol.  A steam or ultrasonic humidifier can help congestion.  You can place a towel over your head and breathe in the steam from hot water coming from a faucet.  Avoid close contacts especially the very young and the elderly.  Cover your mouth when you cough or sneeze.  Always remember to wash your  hands.  Get Help Right Away If:  You develop worsening fever or sinus pain.  You develop a severe head ache or visual changes.  Your symptoms persist after you have completed your treatment plan.  Make sure you  Understand these instructions.  Will watch your condition.  Will get help right away if you are not doing well or get worse.  Your e-visit answers were reviewed by a board certified advanced clinical practitioner to complete your personal care plan.  Depending on the condition, your plan could have included both over the counter or prescription medications.  If there is a problem please reply  once you have received a response from your provider.  Your safety is important to .  If you have drug allergies check your prescription carefully.    You can use MyChart to ask questions about todays visit, request a non-urgent call back, or ask for a work or school excuse for 24 hours related to this e-Visit. If it has been greater than 24 hours you will need to follow up with your provider, or enter a new e-Visit to address those concerns.  You will get an e-mail in the next two days asking about your experience.  I hope that your e-visit has been valuable and will speed your recovery. Thank you for using e-visits.   Greater than 5 minutes, yet less than 10 minutes of time have been spent researching, coordinating, and implementing care  for this patient today

## 2020-01-23 ENCOUNTER — Telehealth: Payer: Self-pay | Admitting: Nurse Practitioner

## 2020-01-23 DIAGNOSIS — J00 Acute nasopharyngitis [common cold]: Secondary | ICD-10-CM

## 2020-01-23 MED ORDER — FLUTICASONE PROPIONATE 50 MCG/ACT NA SUSP: 2.0000 | Freq: Every day | NASAL | 6 refills | Status: DC

## 2020-01-23 NOTE — Progress Notes (Signed)
We are sorry you are not feeling well.  Here is how we plan to help!  Based on what you have shared with me, it looks like you may have a viral upper respiratory infection.  Upper respiratory infections are caused by a large number of viruses; however, rhinovirus is the most common cause.   * you could still possibly have covid- so you may consider being tested.  Symptoms vary from person to person, with common symptoms including sore throat, cough, fatigue or lack of energy and feeling of general discomfort.  A low-grade fever of up to 100.4 may present, but is often uncommon.  Symptoms vary however, and are closely related to a person's age or underlying illnesses.  The most common symptoms associated with an upper respiratory infection are nasal discharge or congestion, cough, sneezing, headache and pressure in the ears and face.  These symptoms usually persist for about 3 to 10 days, but can last up to 2 weeks.  It is important to know that upper respiratory infections do not cause serious illness or complications in most cases.    Upper respiratory infections can be transmitted from person to person, with the most common method of transmission being a person's hands.  The virus is able to live on the skin and can infect other persons for up to 2 hours after direct contact.  Also, these can be transmitted when someone coughs or sneezes; thus, it is important to cover the mouth to reduce this risk.  To keep the spread of the illness at bay, good hand hygiene is very important.  This is an infection that is most likely caused by a virus. There are no specific treatments other than to help you with the symptoms until the infection runs its course.  We are sorry you are not feeling well.  Here is how we plan to help!   For nasal congestion, you may use an oral decongestants such as Mucinex D or if you have glaucoma or high blood pressure use plain Mucinex.  Saline nasal spray or nasal drops can help and  can safely be used as often as needed for congestion.  For your congestion, I have prescribed Fluticasone nasal spray one spray in each nostril twice a day  If you do not have a history of heart disease, hypertension, diabetes or thyroid disease, prostate/bladder issues or glaucoma, you may also use Sudafed to treat nasal congestion.  It is highly recommended that you consult with a pharmacist or your primary care physician to ensure this medication is safe for you to take.     If you have a cough, you may use cough suppressants such as Delsym and Robitussin.  If you have glaucoma or high blood pressure, you can also use Coricidin HBP.    If you have a sore or scratchy throat, use a saltwater gargle-  to  teaspoon of salt dissolved in a 4-ounce to 8-ounce glass of warm water.  Gargle the solution for approximately 15-30 seconds and then spit.  It is important not to swallow the solution.  You can also use throat lozenges/cough drops and Chloraseptic spray to help with throat pain or discomfort.  Warm or cold liquids can also be helpful in relieving throat pain.  For headache, pain or general discomfort, you can use Ibuprofen or Tylenol as directed.   Some authorities believe that zinc sprays or the use of Echinacea may shorten the course of your symptoms.   HOME CARE . Only  take medications as instructed by your medical team. . Be sure to drink plenty of fluids. Water is fine as well as fruit juices, sodas and electrolyte beverages. You may want to stay away from caffeine or alcohol. If you are nauseated, try taking small sips of liquids. How do you know if you are getting enough fluid? Your urine should be a pale yellow or almost colorless. . Get rest. . Taking a steamy shower or using a humidifier may help nasal congestion and ease sore throat pain. You can place a towel over your head and breathe in the steam from hot water coming from a faucet. . Using a saline nasal spray works much the same  way. . Cough drops, hard candies and sore throat lozenges may ease your cough. . Avoid close contacts especially the very young and the elderly . Cover your mouth if you cough or sneeze . Always remember to wash your hands.   GET HELP RIGHT AWAY IF: . You develop worsening fever. . If your symptoms do not improve within 10 days . You develop yellow or green discharge from your nose over 3 days. . You have coughing fits . You develop a severe head ache or visual changes. . You develop shortness of breath, difficulty breathing or start having chest pain . Your symptoms persist after you have completed your treatment plan  MAKE SURE YOU   Understand these instructions.  Will watch your condition.  Will get help right away if you are not doing well or get worse.  Your e-visit answers were reviewed by a board certified advanced clinical practitioner to complete your personal care plan. Depending upon the condition, your plan could have included both over the counter or prescription medications. Please review your pharmacy choice. If there is a problem, you may call our nursing hot line at and have the prescription routed to another pharmacy. Your safety is important to [place]. If you have drug allergies check your prescription carefully.   You can use MyChart to ask questions about today's visit, request a non-urgent call back, or ask for a work or school excuse for 24 hours related to this e-Visit. If it has been greater than 24 hours you will need to follow up with your provider, or enter a new e-Visit to address those concerns. You will get an e-mail in the next two days asking about your experience.  I hope that your e-visit has been valuable and will speed your recovery. Thank you for using e-visits.   5-10 minutes spent reviewing and documenting in chart.

## 2020-03-10 ENCOUNTER — Ambulatory Visit: Payer: Self-pay | Admitting: Advanced Practice Midwife

## 2020-04-23 ENCOUNTER — Telehealth: Payer: Self-pay | Admitting: Family

## 2020-04-23 DIAGNOSIS — J069 Acute upper respiratory infection, unspecified: Secondary | ICD-10-CM

## 2020-04-23 DIAGNOSIS — R519 Headache, unspecified: Secondary | ICD-10-CM

## 2020-04-23 MED ORDER — FLUTICASONE PROPIONATE 50 MCG/ACT NA SUSP: 2.0000 | Freq: Every day | NASAL | 6 refills | Status: DC

## 2020-04-23 NOTE — Progress Notes (Signed)

## 2020-05-20 ENCOUNTER — Telehealth: Payer: Self-pay | Admitting: Nurse Practitioner

## 2020-05-20 DIAGNOSIS — J069 Acute upper respiratory infection, unspecified: Secondary | ICD-10-CM

## 2020-05-20 NOTE — Progress Notes (Signed)
We are sorry you are not feeling well.  Here is how we plan to help!  Based on what you have shared with me, it looks like you may have a viral upper respiratory infection.  Upper respiratory infections are caused by a large number of viruses; however, rhinovirus is the most common cause.   Symptoms vary from person to person, with common symptoms including sore throat, cough, fatigue or lack of energy and feeling of general discomfort.  A low-grade fever of up to 100.4 may present, but is often uncommon.  Symptoms vary however, and are closely related to a person's age or underlying illnesses.  The most common symptoms associated with an upper respiratory infection are nasal discharge or congestion, cough, sneezing, headache and pressure in the ears and face.  These symptoms usually persist for about 3 to 10 days, but can last up to 2 weeks.  It is important to know that upper respiratory infections do not cause serious illness or complications in most cases.    Upper respiratory infections can be transmitted from person to person, with the most common method of transmission being a person's hands.  The virus is able to live on the skin and can infect other persons for up to 2 hours after direct contact.  Also, these can be transmitted when someone coughs or sneezes; thus, it is important to cover the mouth to reduce this risk.  To keep the spread of the illness at bay, good hand hygiene is very important.  This is an infection that is most likely caused by a virus. There are no specific treatments other than to help you with the symptoms until the infection runs its course.  We are sorry you are not feeling well.  Here is how we plan to help!   For nasal congestion, you may use an oral decongestants such as Mucinex D or if you have glaucoma or high blood pressure use plain Mucinex.  Saline nasal spray or nasal drops can help and can safely be used as often as needed for congestion.  For your congestion,  I have prescribed Fluticasone nasal spray one spray in each nostril twice a day As previously ordered for you.  If you do not have a history of heart disease, hypertension, diabetes or thyroid disease, prostate/bladder issues or glaucoma, you may also use Sudafed to treat nasal congestion.  It is highly recommended that you consult with a pharmacist or your primary care physician to ensure this medication is safe for you to take.     If you have a cough, you may use cough suppressants such as Delsym and Robitussin.  If you have glaucoma or high blood pressure, you can also use Coricidin HBP.   If you tested negative and have no fever they say you can go yo work.  If you have a sore or scratchy throat, use a saltwater gargle-  to  teaspoon of salt dissolved in a 4-ounce to 8-ounce glass of warm water.  Gargle the solution for approximately 15-30 seconds and then spit.  It is important not to swallow the solution.  You can also use throat lozenges/cough drops and Chloraseptic spray to help with throat pain or discomfort.  Warm or cold liquids can also be helpful in relieving throat pain.  For headache, pain or general discomfort, you can use Ibuprofen or Tylenol as directed.   Some authorities believe that zinc sprays or the use of Echinacea may shorten the course of your symptoms.  HOME CARE . Only take medications as instructed by your medical team. . Be sure to drink plenty of fluids. Water is fine as well as fruit juices, sodas and electrolyte beverages. You may want to stay away from caffeine or alcohol. If you are nauseated, try taking small sips of liquids. How do you know if you are getting enough fluid? Your urine should be a pale yellow or almost colorless. . Get rest. . Taking a steamy shower or using a humidifier may help nasal congestion and ease sore throat pain. You can place a towel over your head and breathe in the steam from hot water coming from a faucet. . Using a saline nasal  spray works much the same way. . Cough drops, hard candies and sore throat lozenges may ease your cough. . Avoid close contacts especially the very young and the elderly . Cover your mouth if you cough or sneeze . Always remember to wash your hands.   GET HELP RIGHT AWAY IF: . You develop worsening fever. . If your symptoms do not improve within 10 days . You develop yellow or green discharge from your nose over 3 days. . You have coughing fits . You develop a severe head ache or visual changes. . You develop shortness of breath, difficulty breathing or start having chest pain . Your symptoms persist after you have completed your treatment plan  MAKE SURE YOU   Understand these instructions.  Will watch your condition.  Will get help right away if you are not doing well or get worse.  Your e-visit answers were reviewed by a board certified advanced clinical practitioner to complete your personal care plan. Depending upon the condition, your plan could have included both over the counter or prescription medications. Please review your pharmacy choice. If there is a problem, you may call our nursing hot line at and have the prescription routed to another pharmacy. Your safety is important to [place]. If you have drug allergies check your prescription carefully.   You can use MyChart to ask questions about today's visit, request a non-urgent call back, or ask for a work or school excuse for 24 hours related to this e-Visit. If it has been greater than 24 hours you will need to follow up with your provider, or enter a new e-Visit to address those concerns. You will get an e-mail in the next two days asking about your experience.  I hope that your e-visit has been valuable and will speed your recovery. Thank you for using e-visits.  5-10 minutes spent reviewing and documenting in chart.

## 2020-05-20 NOTE — Progress Notes (Signed)
E-Visit for Corona Virus Screening  We are sorry you are not feeling well. We are here to help!  You have tested positive for COVID-19, meaning that you were infected with the novel coronavirus and could give the virus to others.  It is vitally important that you stay home so you do not spread it to others.      Please continue isolation at home, for at least 10 days since the start of your symptoms and until you have had 24 hours with no fever (without taking a fever reducer) and with improving of symptoms.  If you have no symptoms but tested positive (or all symptoms resolve after 5 days and you have no fever) you can leave your house but continue to wear a mask around others for an additional 5 days. If you have a fever,continue to stay home until you have had 24 hours of no fever. Most cases improve 5-10 days from onset but we have seen a small number of patients who have gotten worse after the 10 days.  Please be sure to watch for worsening symptoms and remain taking the proper precautions.   Go to the nearest hospital ED for assessment if fever/cough/breathlessness are severe or illness seems like a threat to life.    The following symptoms may appear 2-14 days after exposure: . Fever . Cough . Shortness of breath or difficulty breathing . Chills . Repeated shaking with chills . Muscle pain . Headache . Sore throat . New loss of taste or smell . Fatigue . Congestion or runny nose . Nausea or vomiting . Diarrhea  You have been enrolled in [facility] Monitoring for COVID-19. Daily you will receive a questionnaire within the MyChart website. Our COVID-19 response team will be monitoring your responses daily.  You can use medication such as motrin or tylenol for fever and sore throat.  You have tested positive for Covid but because you are not considered high risk you do not qualify for monoclonal antibody infusion.  Supportive care is all that is needed.  Since you had the covid  vaccines, then you need to quarantine for 5 days from today. There is a work note in your my chart.  You may also take acetaminophen (Tylenol) as needed for fever.  HOME CARE: . Only take medications as instructed by your medical team. . Drink plenty of fluids and get plenty of rest. . A steam or ultrasonic humidifier can help if you have congestion.   GET HELP RIGHT AWAY IF YOU HAVE EMERGENCY WARNING SIGNS.  Call 911 or proceed to your closest emergency facility if: . You develop worsening high fever. . Trouble breathing . Bluish lips or face . Persistent pain or pressure in the chest . New confusion . Inability to wake or stay awake . You cough up blood. . Your symptoms become more severe . Inability to hold down food or fluids  This list is not all possible symptoms. Contact your medical provider for any symptoms that are severe or concerning to you.    Your e-visit answers were reviewed by a board certified advanced clinical practitioner to complete your personal care plan.  Depending on the condition, your plan could have included both over the counter or prescription medications.  If there is a problem please reply once you have received a response from your provider.  Your safety is important to [place].  If you have drug allergies check your prescription carefully.    You can use MyChart  to ask questions about today's visit, request a non-urgent call back, or ask for a work or school excuse for 24 hours related to this e-Visit. If it has been greater than 24 hours you will need to follow up with your provider, or enter a new e-Visit to address those concerns. You will get an e-mail in the next two days asking about your experience.  I hope that your e-visit has been valuable and will speed your recovery. Thank you for using e-visits.

## 2020-06-04 ENCOUNTER — Encounter: Payer: Self-pay | Admitting: Family Medicine

## 2021-03-10 ENCOUNTER — Telehealth: Payer: Self-pay | Admitting: Physician Assistant

## 2021-03-10 DIAGNOSIS — J069 Acute upper respiratory infection, unspecified: Secondary | ICD-10-CM

## 2021-03-10 MED ORDER — BENZONATATE 100 MG PO CAPS: 100.0000 mg | ORAL_CAPSULE | Freq: Three times a day (TID) | ORAL | 0 refills | Status: DC | PRN

## 2021-03-10 NOTE — Progress Notes (Signed)

## 2021-03-10 NOTE — Progress Notes (Signed)
I have spent 5 minutes in review of e-visit questionnaire, review and updating patient chart, medical decision making and response to patient.   Kaycee Mcgaugh Cody Sarie Stall, PA-C    

## 2021-04-01 ENCOUNTER — Telehealth: Payer: Self-pay | Admitting: Physician Assistant

## 2021-04-01 DIAGNOSIS — A6004 Herpesviral vulvovaginitis: Secondary | ICD-10-CM

## 2021-04-01 NOTE — Progress Notes (Signed)
Based on what you shared with me, I feel your condition warrants further evaluation and I recommend that you be seen for a face to face visit.  Please contact your primary care physician practice to be seen. Many offices offer virtual options to be seen via video if you are not comfortable going in person to a medical facility at this time.  For a preventive ongoing medication you will need to reach out to your primary care provider as we do not do chronic management through e-visits.   NOTE: You will NOT be charged for this eVisit.  If you do not have a PCP, [facility] offers a free physician referral service available at [phone]. Our trained staff has the experience, knowledge and resources to put you in touch with a physician who is right for you.    If you are having a true medical emergency please call 911.   Your e-visit answers were reviewed by a board certified advanced clinical practitioner to complete your personal care plan.  Thank you for using e-Visits.

## 2021-04-03 ENCOUNTER — Telehealth: Payer: Self-pay | Admitting: Physician Assistant

## 2021-04-03 DIAGNOSIS — A6004 Herpesviral vulvovaginitis: Secondary | ICD-10-CM

## 2021-04-03 DIAGNOSIS — R3989 Other symptoms and signs involving the genitourinary system: Secondary | ICD-10-CM

## 2021-04-03 MED ORDER — SULFAMETHOXAZOLE-TRIMETHOPRIM 800-160 MG PO TABS: 1.0000 | ORAL_TABLET | Freq: Two times a day (BID) | ORAL | 0 refills | Status: DC

## 2021-04-03 MED ORDER — VALACYCLOVIR HCL 500 MG PO TABS: 500.0000 mg | ORAL_TABLET | Freq: Every day | ORAL | 0 refills | Status: DC

## 2021-04-03 NOTE — Progress Notes (Signed)
Virtual Visit Consent   Megan Leon, you are scheduled for a virtual visit with a [facility] provider today.     Just as with appointments in the office, your consent must be obtained to participate.  Your consent will be active for this visit and any virtual visit you may have with one of our providers in the next 365 days.     If you have a MyChart account, a copy of this consent can be sent to you electronically.  All virtual visits are billed to your insurance company just like a traditional visit in the office.    As this is a virtual visit, video technology does not allow for your provider to perform a traditional examination.  This may limit your provider's ability to fully assess your condition.  If your provider identifies any concerns that need to be evaluated in person or the need to arrange testing (such as labs, EKG, etc.), we will make arrangements to do so.     Although advances in technology are sophisticated, we cannot ensure that it will always work on either your end or our end.  If the connection with a video visit is poor, the visit may have to be switched to a telephone visit.  With either a video or telephone visit, we are not always able to ensure that we have a secure connection.     I need to obtain your verbal consent now.   Are you willing to proceed with your visit today?    Megan Leon has provided verbal consent on 04/03/2021 for a virtual visit (video or telephone).   Megan Loveless, PA-C   Date: 04/03/2021 12:35 PM   Virtual Visit via Video Note   I, Megan Leon, connected with  Megan Leon  ([id number], 10/24/1990) on 04/03/21 at 12:30 PM EST by a video-enabled telemedicine application and verified that I am speaking with the correct person using two identifiers.  Location: Patient: Virtual Visit Location Patient: Mobile Provider: Virtual Visit Location Provider: Home Office   I discussed the limitations of evaluation and management  by telemedicine and the availability of in person appointments. The patient expressed understanding and agreed to proceed.    History of Present Illness: Megan Leon is a 30 y.o. who identifies as a female who was assigned female at birth, and is being seen today for suspected UTI and genital herpes outbreak.  HPI: Urinary Tract Infection  This is a new problem. The current episode started yesterday. The problem occurs every urination. The problem has been gradually worsening. The quality of the pain is described as aching and burning. The pain is moderate. There has been no fever. Associated symptoms include frequency, hematuria, hesitancy and urgency. Pertinent negatives include no chills, discharge, flank pain or nausea. She has tried increased fluids and acetaminophen for the symptoms. The treatment provided mild relief. Her past medical history is significant for recurrent UTIs.    Has genital herpes. On Valtrex 500mg  daily for suppression. Rx has run out. Unable to get appt with PCP for refills.  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 05/15/2018   Depression 02/01/2017   Encounter for physical examination, contraception,  and Papanicolaou smear of cervix 02/01/2017   Smoker 08/21/2013   Genital HSV 07/26/2012    Allergies: No Known Allergies Medications:  Current Outpatient Medications:    sulfamethoxazole-trimethoprim (BACTRIM DS) 800-160 MG tablet, Take 1 tablet by mouth 2 (two) times daily., Disp: 10 tablet, Rfl: 0   valACYclovir (VALTREX) 500 MG tablet, Take 1 tablet (500 mg total) by mouth daily., Disp: 30 tablet, Rfl: 0   acetaminophen  (TYLENOL) 325 MG tablet, Take 2 tablets (650 mg total) by mouth every 6 (six) hours as needed., Disp: 30 tablet, Rfl: 0   benzonatate (TESSALON) 100 MG capsule, Take 1 capsule (100 mg total) by mouth 3 (three) times daily as needed for cough., Disp: 30 capsule, Rfl: 0   fluticasone (FLONASE) 50 MCG/ACT nasal spray, Place 2 sprays into both nostrils daily., Disp: 16 g, Rfl: 6   ipratropium (ATROVENT) 0.06 % nasal spray, Place 2 sprays into both nostrils 4 (four) times daily., Disp: 15 mL, Rfl: 12   Levonorgestrel-Ethinyl Estradiol (AMETHIA) 0.15-0.03 &0.01 MG tablet, Take 1 tablet by mouth daily., Disp: 1 Package, Rfl: 4   naproxen (NAPROSYN) 500 MG tablet, Take 1 tablet (500 mg total) by mouth 2 (two) times daily with a meal., Disp: 30 tablet, Rfl: 0  Observations/Objective: Patient is well-developed, well-nourished in no acute distress.  Resting comfortably Head is normocephalic, atraumatic.  No labored breathing.  Speech is clear and coherent with logical content.  Patient is alert and oriented at baseline.    Assessment and Plan: 1. Suspected UTI - sulfamethoxazole-trimethoprim (BACTRIM DS) 800-160 MG tablet; Take 1 tablet by mouth 2 (two) times daily.  Dispense: 10 tablet; Refill: 0  2. Herpes simplex vulvovaginitis - valACYclovir (VALTREX) 500 MG tablet; Take 1 tablet (500 mg total) by mouth daily.  Dispense: 30 tablet; Refill: 0  - Worsening symptoms.  - Will treat empirically with Bactrim  - Continue to push fluids.  - Valtrex prescribed for one month to allow time to follow up with PCP for future refills - She is to seek in person evaluation if symptoms do not improve or if they worsen.    Follow Up Instructions: I discussed the assessment and treatment plan with the patient. The patient was provided an opportunity to ask questions and all were answered. The patient agreed with the plan and demonstrated an understanding of the instructions.  A copy of instructions were sent to  the patient via MyChart unless otherwise noted below.    The patient was advised to call back or seek an in-person evaluation if the symptoms worsen or if the condition fails to improve as anticipated.  Time:  I spent 10 minutes with the patient via telehealth technology discussing the above problems/concerns.    Mar Daring, PA-C

## 2021-04-03 NOTE — Patient Instructions (Signed)
Megan Leon, thank you for joining [name], PA-C for today's virtual visit.  While this provider is not your primary care provider (PCP), if your PCP is located in our provider database this encounter information will be shared with them immediately following your visit.  Consent: (Patient) Megan Leon provided verbal consent for this virtual visit at the beginning of the encounter.  Current Medications:  Current Outpatient Medications:    sulfamethoxazole-trimethoprim (BACTRIM DS) 800-160 MG tablet, Take 1 tablet by mouth 2 (two) times daily., Disp: 10 tablet, Rfl: 0   valACYclovir (VALTREX) 500 MG tablet, Take 1 tablet (500 mg total) by mouth daily., Disp: 30 tablet, Rfl: 0   acetaminophen (TYLENOL) 325 MG tablet, Take 2 tablets (650 mg total) by mouth every 6 (six) hours as needed., Disp: 30 tablet, Rfl: 0   benzonatate (TESSALON) 100 MG capsule, Take 1 capsule (100 mg total) by mouth 3 (three) times daily as needed for cough., Disp: 30 capsule, Rfl: 0   fluticasone (FLONASE) 50 MCG/ACT nasal spray, Place 2 sprays into both nostrils daily., Disp: 16 g, Rfl: 6   ipratropium (ATROVENT) 0.06 % nasal spray, Place 2 sprays into both nostrils 4 (four) times daily., Disp: 15 mL, Rfl: 12   Levonorgestrel-Ethinyl Estradiol (AMETHIA) 0.15-0.03 &0.01 MG tablet, Take 1 tablet by mouth daily., Disp: 1 Package, Rfl: 4   naproxen (NAPROSYN) 500 MG tablet, Take 1 tablet (500 mg total) by mouth 2 (two) times daily with a meal., Disp: 30 tablet, Rfl: 0   Medications ordered in this encounter:  Meds ordered this encounter  Medications   sulfamethoxazole-trimethoprim (BACTRIM DS) 800-160 MG tablet    Sig: Take 1 tablet by mouth 2 (two) times daily.    Dispense:  10 tablet    Refill:  0    Order Specific Question:   Supervising Provider    Answer:   Hyacinth Meeker, BRIAN [[id number]]   valACYclovir (VALTREX) 500 MG tablet    Sig: Take 1 tablet (500 mg total) by mouth daily.    Dispense:  30 tablet     Refill:  0    Order Specific Question:   Supervising Provider    Answer:   Hyacinth Meeker, BRIAN [[id number]]     *If you need refills on other medications prior to your next appointment, please contact your pharmacy*  Follow-Up: Call back or seek an in-person evaluation if the symptoms worsen or if the condition fails to improve as anticipated.  Other Instructions Urinary Tract Infection, Adult A urinary tract infection (UTI) is an infection of any part of the urinary tract. The urinary tract includes: The kidneys. The ureters. The bladder. The urethra. These organs make, store, and get rid of pee (urine) in the body. What are the causes? This infection is caused by germs (bacteria) in your genital area. These germs grow and cause swelling (inflammation) of your urinary tract. What increases the risk? The following factors may make you more likely to develop this condition: Using a small, thin tube (catheter) to drain pee. Not being able to control when you pee or poop (incontinence). Being female. If you are female, these things can increase the risk: Using these methods to prevent pregnancy: A medicine that kills sperm (spermicide). A device that blocks sperm (diaphragm). Having low levels of a female hormone (estrogen). Being pregnant. You are more likely to develop this condition if: You have genes that add to your risk. You are sexually active. You take antibiotic medicines. You have trouble  peeing because of: A prostate that is bigger than normal, if you are female. A blockage in the part of your body that drains pee from the bladder. A kidney stone. A nerve condition that affects your bladder. Not getting enough to drink. Not peeing often enough. You have other conditions, such as: Diabetes. A weak disease-fighting system (immune system). Sickle cell disease. Gout. Injury of the spine. What are the signs or symptoms? Symptoms of this condition include: Needing to pee right  away. Peeing small amounts often. Pain or burning when peeing. Blood in the pee. Pee that smells bad or not like normal. Trouble peeing. Pee that is cloudy. Fluid coming from the vagina, if you are female. Pain in the belly or lower back. Other symptoms include: Vomiting. Not feeling hungry. Feeling mixed up (confused). This may be the first symptom in older adults. Being tired and grouchy (irritable). A fever. Watery poop (diarrhea). How is this treated? Taking antibiotic medicine. Taking other medicines. Drinking enough water. In some cases, you may need to see a specialist. Follow these instructions at home: Medicines Take over-the-counter and prescription medicines only as told by your doctor. If you were prescribed an antibiotic medicine, take it as told by your doctor. Do not stop taking it even if you start to feel better. General instructions Make sure you: Pee until your bladder is empty. Do not hold pee for a long time. Empty your bladder after sex. Wipe from front to back after peeing or pooping if you are a female. Use each tissue one time when you wipe. Drink enough fluid to keep your pee pale yellow. Keep all follow-up visits. Contact a doctor if: You do not get better after 1-2 days. Your symptoms go away and then come back. Get help right away if: You have very bad back pain. You have very bad pain in your lower belly. You have a fever. You have chills. You feeling like you will vomit or you vomit. Summary A urinary tract infection (UTI) is an infection of any part of the urinary tract. This condition is caused by germs in your genital area. There are many risk factors for a UTI. Treatment includes antibiotic medicines. Drink enough fluid to keep your pee pale yellow. This information is not intended to replace advice given to you by your health care provider. Make sure you discuss any questions you have with your health care provider. Document  Revised: 11/30/2019 Document Reviewed: 11/30/2019 Elsevier Patient Education  2022 [employer].    If you have been instructed to have an in-person evaluation today at a local Urgent Care facility, please use the link below. It will take you to a list of all of our available [facility], including address, phone number and hours of operation. Please do not delay care.  [facility]  If you or a family member do not have a primary care provider, use the link below to schedule a visit and establish care. When you choose a [facility] primary care physician or advanced practice provider, you gain a long-term partner in health. Find a Primary Care Provider  Learn more about [facility]'s in-office and virtual care options:  - Get Care Now

## 2021-05-12 ENCOUNTER — Encounter: Payer: Self-pay | Admitting: Family Medicine

## 2021-05-12 ENCOUNTER — Telehealth: Payer: Self-pay | Admitting: Family Medicine

## 2021-05-12 DIAGNOSIS — R6889 Other general symptoms and signs: Secondary | ICD-10-CM

## 2021-05-12 MED ORDER — OSELTAMIVIR PHOSPHATE 75 MG PO CAPS: 75.0000 mg | ORAL_CAPSULE | Freq: Two times a day (BID) | ORAL | 0 refills | Status: AC

## 2021-05-12 NOTE — Progress Notes (Signed)
E visit for Flu like symptoms   We are sorry that you are not feeling well.  Here is how we plan to help! Based on what you have shared with me it looks like you may have a respiratory virus that may be influenza.  Influenza or "the flu" is   an infection caused by a respiratory virus. The flu virus is highly contagious and persons who did not receive their yearly flu vaccination may "catch" the flu from close contact.  We have anti-viral medications to treat the viruses that cause this infection. They are not a "cure" and only shorten the course of the infection. These prescriptions are most effective when they are given within the first 2 days of "flu" symptoms. Antiviral medication are indicated if you have a high risk of complications from the flu. You should  also consider an antiviral medication if you are in close contact with someone who is at risk. These medications can help patients avoid complications from the flu  but have side effects that you should know. Possible side effects from Tamiflu or oseltamivir include nausea, vomiting, diarrhea, dizziness, headaches, eye redness, sleep problems or other respiratory symptoms. You should not take Tamiflu if you have an allergy to oseltamivir or any to the ingredients in Tamiflu.  Based upon your symptoms and potential risk factors I have prescribed Oseltamivir (Tamiflu).  It has been sent to your designated pharmacy.  You will take one 75 mg capsule orally twice a day for the next 5 days.  ANYONE WHO HAS FLU SYMPTOMS SHOULD: Stay home. The flu is highly contagious and going out or to work exposes others! Be sure to drink plenty of fluids. Water is fine as well as fruit juices, sodas and electrolyte beverages. You may want to stay away from caffeine or alcohol. If you are nauseated, try taking small sips of liquids. How do you know if you are getting enough fluid? Your urine should be a pale yellow or almost colorless. Get rest. Taking a steamy  shower or using a humidifier may help nasal congestion and ease sore throat pain. Using a saline nasal spray works much the same way. Cough drops, hard candies and sore throat lozenges may ease your cough. Line up a caregiver. Have someone check on you regularly.   GET HELP RIGHT AWAY IF: You cannot keep down liquids or your medications. You become short of breath Your fell like you are going to pass out or loose consciousness. Your symptoms persist after you have completed your treatment plan MAKE SURE YOU  Understand these instructions. Will watch your condition. Will get help right away if you are not doing well or get worse.  Your e-visit answers were reviewed by a board certified advanced clinical practitioner to complete your personal care plan.  Depending on the condition, your plan could have included both over the counter or prescription medications.  If there is a problem please reply  once you have received a response from your provider.  Your safety is important to us.  If you have drug allergies check your prescription carefully.    You can use MyChart to ask questions about today's visit, request a non-urgent call back, or ask for a work or school excuse for 24 hours related to this e-Visit. If it has been greater than 24 hours you will need to follow up with your provider, or enter a new e-Visit to address those concerns.  You will get an e-mail in the next   two days asking about your experience.  I hope that your e-visit has been valuable and will speed your recovery. Thank you for using e-visits.  I provided 5 minutes of non face-to-face time during this encounter for chart review, medication and order placement, as well as and documentation.   

## 2021-07-16 ENCOUNTER — Telehealth: Payer: Self-pay | Admitting: Physician Assistant

## 2021-07-16 DIAGNOSIS — U071 COVID-19: Secondary | ICD-10-CM

## 2021-07-16 MED ORDER — FLUTICASONE PROPIONATE 50 MCG/ACT NA SUSP: 2.0000 | Freq: Every day | NASAL | 0 refills | Status: DC

## 2021-07-16 MED ORDER — BENZONATATE 100 MG PO CAPS: 100.0000 mg | ORAL_CAPSULE | Freq: Three times a day (TID) | ORAL | 0 refills | Status: AC | PRN

## 2021-07-16 NOTE — Progress Notes (Signed)
I have spent 5 minutes in review of e-visit questionnaire, review and updating patient chart, medical decision making and response to patient.   Haywood Meinders Cody Aidan Moten, PA-C    

## 2021-07-16 NOTE — Progress Notes (Signed)
? ? ?E-Visit  for Positive Covid Test Result ? ?We are sorry you are not feeling well. We are here to help! ? ?You have tested positive for COVID-19, meaning that you were infected with the novel coronavirus and could give the virus to others.  It is vitally important that you stay home so you do not spread it to others.     ? ?Please continue isolation at home, for at least 10 days since the start of your symptoms and until you have had 24 hours with no fever (without taking a fever reducer) and with improving of symptoms.  If you have no symptoms but tested positive (or all symptoms resolve after 5 days and you have no fever) you can leave your house but continue to wear a mask around others for an additional 5 days. If you have a fever,continue to stay home until you have had 24 hours of no fever. Most cases improve 5-10 days from onset but we have seen a small number of patients who have gotten worse after the 10 days.  Please be sure to watch for worsening symptoms and remain taking the proper precautions.  ? ?Go to the nearest hospital ED for assessment if fever/cough/breathlessness are severe or illness seems like a threat to life.   ? ?The following symptoms may appear 2-14 days after exposure: ?Fever ?Cough ?Shortness of breath or difficulty breathing ?Chills ?Repeated shaking with chills ?Muscle pain ?Headache ?Sore throat ?New loss of taste or smell ?Fatigue ?Congestion or runny nose ?Nausea or vomiting ?Diarrhea ? ?You have been enrolled in MyChart Home Monitoring for COVID-19. Daily you will receive a questionnaire within the MyChart website. Our COVID-19 response team will be monitoring your responses daily. ? ?You can use medication such as prescription cough medication called Tessalon Perles 100 mg. You may take 1-2 capsules every 8 hours as needed for cough and prescription for Fluticasone nasal spray 2 sprays in each nostril one time per day ? ?You may also take acetaminophen (Tylenol) as needed  for fever. ? ?Please keep well-hydrated and get plenty of rest. ?Start a saline nasal rinse to flush out your nasal passages. ?You can use plain Mucinex to help thin congestion. ?If you have a humidifier, running in the bedroom at night. ?I want you to start OTC vitamin D3 1000 units daily, vitamin C 1000 mg daily, and a zinc supplement. ?Please take prescribed medications as directed. ? ?You have been enrolled in a MyChart symptom monitoring program. ?Please answer these questions daily so we can keep track of how you are doing. ? ? ?HOME CARE: ?Only take medications as instructed by your medical team. ?Drink plenty of fluids and get plenty of rest. ?A steam or ultrasonic humidifier can help if you have congestion.  ? ?GET HELP RIGHT AWAY IF YOU HAVE EMERGENCY WARNING SIGNS.  ?Call 911 or proceed to your closest emergency facility if: ?You develop worsening high fever. ?Trouble breathing ?Bluish lips or face ?Persistent pain or pressure in the chest ?New confusion ?Inability to wake or stay awake ?You cough up blood. ?Your symptoms become more severe ?Inability to hold down food or fluids ? ?This list is not all possible symptoms. Contact your medical provider for any symptoms that are severe or concerning to you. ? ? ? ?Your e-visit answers were reviewed by a board certified advanced clinical practitioner to complete your personal care plan.  Depending on the condition, your plan could have included both over the counter or prescription  medications.  If there is a problem please reply once you have received a response from your provider. ? ?Your safety is important to [place].  If you have drug allergies check your prescription carefully.   ? ?You can use MyChart to ask questions about today's visit, request a non-urgent call back, or ask for a work or school excuse for 24 hours related to this e-Visit. If it has been greater than 24 hours you will need to follow up with your provider, or enter a new e-Visit to address  those concerns. ?You will get an e-mail in the next two days asking about your experience.  I hope that your e-visit has been valuable and will speed your recovery. Thank you for using e-visits. ? ? ? ? ? ?

## 2021-08-12 ENCOUNTER — Telehealth: Payer: Self-pay | Admitting: Physician Assistant

## 2021-08-12 DIAGNOSIS — H109 Unspecified conjunctivitis: Secondary | ICD-10-CM

## 2021-08-12 MED ORDER — OFLOXACIN 0.3 % OP SOLN: 1.0000 [drp] | Freq: Four times a day (QID) | OPHTHALMIC | 0 refills | Status: DC

## 2021-08-12 NOTE — Progress Notes (Signed)
?Virtual Visit Consent  ? ?Megan Leon, you are scheduled for a virtual visit with a [facility] provider today.   ?  ?Just as with appointments in the office, your consent must be obtained to participate.  Your consent will be active for this visit and any virtual visit you may have with one of our providers in the next 365 days.   ?  ?If you have a MyChart account, a copy of this consent can be sent to you electronically.  All virtual visits are billed to your insurance company just like a traditional visit in the office.   ? ?As this is a virtual visit, video technology does not allow for your provider to perform a traditional examination.  This may limit your provider's ability to fully assess your condition.  If your provider identifies any concerns that need to be evaluated in person or the need to arrange testing (such as labs, EKG, etc.), we will make arrangements to do so.   ?  ?Although advances in technology are sophisticated, we cannot ensure that it will always work on either your end or our end.  If the connection with a video visit is poor, the visit may have to be switched to a telephone visit.  With either a video or telephone visit, we are not always able to ensure that we have a secure connection.    ? ?I need to obtain your verbal consent now.   Are you willing to proceed with your visit today?  ?  ?Megan Leon has provided verbal consent on 08/12/2021 for a virtual visit (video or telephone). ?  ?Mar Daring, PA-C  ? ?Date: 08/12/2021 11:25 AM ? ? ?Virtual Visit via Video Note  ? ?Megan Leon, connected with  Megan Leon  ([id number], March 27, 1991) on 08/12/21 at 11:15 AM EDT by a video-enabled telemedicine application and verified that I am speaking with the correct person using two identifiers. ? ?Location: ?Patient: Virtual Visit Location Patient: Home ?Provider: Virtual Visit Location Provider: Home Office ?  ?I discussed the limitations of evaluation and management by  telemedicine and the availability of in person appointments. The patient expressed understanding and agreed to proceed.   ? ?History of Present Illness: ?Megan Leon is a 31 y.o. who identifies as a female who was assigned female at birth, and is being seen today for possible pink eye. ? ?HPI: Conjunctivitis  ?The current episode started today. The onset was sudden. The problem occurs continuously. The problem has been unchanged. The problem is mild. Nothing relieves the symptoms. Nothing aggravates the symptoms. Associated symptoms include eye itching, eye discharge (purulent drainage) and eye redness. Pertinent negatives include no fever, no decreased vision, no double vision, no photophobia, no URI and no eye pain. The eye pain is mild. The left eye is affected. The eye pain is not associated with movement. The eyelid exhibits swelling.   ?Exposure from kids. ? ?Problems:  ?Patient Active Problem List  ? Diagnosis Date Noted  ? Encounter for Nexplanon removal 07/31/2019  ? Pregnancy examination or test, negative result 07/31/2019  ? Encounter for initial prescription of contraceptive pills 07/31/2019  ? Nexplanon insertion 07/12/2019  ? Herpes simplex vulvovaginitis 02/21/2019  ? Superficial mixed comedonal and inflammatory acne vulgaris 05/15/2018  ? Depression 02/01/2017  ? Encounter for physical examination, contraception, and Papanicolaou smear of cervix 02/01/2017  ? Smoker 08/21/2013  ? Genital HSV 07/26/2012  ?  ?Allergies: No Known Allergies ?Medications:  ?  Current Outpatient Medications:  ?  ofloxacin (OCUFLOX) 0.3 % ophthalmic solution, Place 1 drop into the left eye 4 (four) times daily. X 5 days, Disp: 5 mL, Rfl: 0 ?  acetaminophen (TYLENOL) 325 MG tablet, Take 2 tablets (650 mg total) by mouth every 6 (six) hours as needed., Disp: 30 tablet, Rfl: 0 ?  benzonatate (TESSALON) 100 MG capsule, Take 1 capsule (100 mg total) by mouth 3 (three) times daily as needed for cough., Disp: 30 capsule, Rfl: 0 ?   fluticasone (FLONASE) 50 MCG/ACT nasal spray, Place 2 sprays into both nostrils daily., Disp: 16 g, Rfl: 0 ?  ipratropium (ATROVENT) 0.06 % nasal spray, Place 2 sprays into both nostrils 4 (four) times daily., Disp: 15 mL, Rfl: 12 ?  Levonorgestrel-Ethinyl Estradiol (AMETHIA) 0.15-0.03 &0.01 MG tablet, Take 1 tablet by mouth daily., Disp: 1 Package, Rfl: 4 ?  naproxen (NAPROSYN) 500 MG tablet, Take 1 tablet (500 mg total) by mouth 2 (two) times daily with a meal., Disp: 30 tablet, Rfl: 0 ?  valACYclovir (VALTREX) 500 MG tablet, Take 1 tablet (500 mg total) by mouth daily., Disp: 30 tablet, Rfl: 0 ? ?Observations/Objective: ?Patient is well-developed, well-nourished in no acute distress.  ?Resting comfortably   ?Head is normocephalic, atraumatic.  ?No labored breathing.  ?Speech is clear and coherent with logical content.  ?Patient is alert and oriented at baseline.  ? ? ?Assessment and Plan: ?1. Bacterial conjunctivitis of left eye ?- ofloxacin (OCUFLOX) 0.3 % ophthalmic solution; Place 1 drop into the left eye 4 (four) times daily. X 5 days  Dispense: 5 mL; Refill: 0 ? ?- Suspect bacterial conjunctivitis ?- Ofloxacin prescribed ?- Warm compresses ?- Good hand hygiene ?- Seek in person evaluation if symptoms worsen or fail to improve ? ?Follow Up Instructions: ?I discussed the assessment and treatment plan with the patient. The patient was provided an opportunity to ask questions and all were answered. The patient agreed with the plan and demonstrated an understanding of the instructions.  A copy of instructions were sent to the patient via MyChart unless otherwise noted below.  ? ? ?The patient was advised to call back or seek an in-person evaluation if the symptoms worsen or if the condition fails to improve as anticipated. ? ?Time:  ?I spent 10 minutes with the patient via telehealth technology discussing the above problems/concerns.   ? ?Mar Daring, PA-C ?

## 2021-08-12 NOTE — Patient Instructions (Signed)
?[name], thank you for joining [name], PA-C for today's virtual visit.  While this provider is not your primary care provider (PCP), if your PCP is located in our provider database this encounter information will be shared with them immediately following your visit. ? ?Consent: ?(Patient) Megan Leon provided verbal consent for this virtual visit at the beginning of the encounter. ? ?Current Medications: ? ?Current Outpatient Medications:  ?  ofloxacin (OCUFLOX) 0.3 % ophthalmic solution, Place 1 drop into the left eye 4 (four) times daily. X 5 days, Disp: 5 mL, Rfl: 0 ?  acetaminophen (TYLENOL) 325 MG tablet, Take 2 tablets (650 mg total) by mouth every 6 (six) hours as needed., Disp: 30 tablet, Rfl: 0 ?  benzonatate (TESSALON) 100 MG capsule, Take 1 capsule (100 mg total) by mouth 3 (three) times daily as needed for cough., Disp: 30 capsule, Rfl: 0 ?  fluticasone (FLONASE) 50 MCG/ACT nasal spray, Place 2 sprays into both nostrils daily., Disp: 16 g, Rfl: 0 ?  ipratropium (ATROVENT) 0.06 % nasal spray, Place 2 sprays into both nostrils 4 (four) times daily., Disp: 15 mL, Rfl: 12 ?  Levonorgestrel-Ethinyl Estradiol (AMETHIA) 0.15-0.03 &0.01 MG tablet, Take 1 tablet by mouth daily., Disp: 1 Package, Rfl: 4 ?  naproxen (NAPROSYN) 500 MG tablet, Take 1 tablet (500 mg total) by mouth 2 (two) times daily with a meal., Disp: 30 tablet, Rfl: 0 ?  valACYclovir (VALTREX) 500 MG tablet, Take 1 tablet (500 mg total) by mouth daily., Disp: 30 tablet, Rfl: 0  ? ?Medications ordered in this encounter:  ?Meds ordered this encounter  ?Medications  ? ofloxacin (OCUFLOX) 0.3 % ophthalmic solution  ?  Sig: Place 1 drop into the left eye 4 (four) times daily. X 5 days  ?  Dispense:  5 mL  ?  Refill:  0  ?  Order Specific Question:   Supervising Provider  ?  Answer:   Eber Hong [[id number]]  ?  ? ?*If you need refills on other medications prior to your next appointment, please contact your  pharmacy* ? ?Follow-Up: ?Call back or seek an in-person evaluation if the symptoms worsen or if the condition fails to improve as anticipated. ? ?Other Instructions ? ?Bacterial Conjunctivitis, Adult ?Bacterial conjunctivitis is an infection of the clear membrane that covers the white part of the eye and the inner surface of the eyelid (conjunctiva). When the blood vessels in the conjunctiva become inflamed, the eye becomes red or pink. The eye often feels irritated or itchy. Bacterial conjunctivitis spreads easily from person to person (is contagious). It also spreads easily from one eye to the other eye. ?What are the causes? ?This condition is caused by bacteria. You may get the infection if you come into close contact with: ?A person who is infected with the bacteria. ?Items that are contaminated with the bacteria, such as a face towel, contact lens solution, or eye makeup. ?What increases the risk? ?You are more likely to develop this condition if: ?You are exposed to other people who have the infection. ?You wear contact lenses. ?You have a sinus infection. ?You have had a recent eye injury or surgery. ?You have a weak body defense system (immune system). ?You have a medical condition that causes dry eyes. ?What are the signs or symptoms? ?Symptoms of this condition include: ?Thick, yellowish discharge from the eye. This may turn into a crust on the eyelid overnight and cause your eyelids to stick together. ?Tearing or watery  eyes. ?Itchy eyes. ?Burning feeling in your eyes. ?Eye redness. ?Swollen eyelids. ?Blurred vision. ?How is this diagnosed? ?This condition is diagnosed based on your symptoms and medical history. Your health care provider may also take a sample of discharge from your eye to find the cause of your infection. ?How is this treated? ?This condition may be treated with: ?Antibiotic eye drops or ointment to clear the infection more quickly and prevent the spread of infection to  others. ?Antibiotic medicines taken by mouth (orally) to treat infections that do not respond to drops or ointments or that last longer than 10 days. ?Cool, wet cloths (cool compresses) placed on the eyes. ?Artificial tears applied 2-6 times a day. ?Follow these instructions at home: ?Medicines ?Take or apply your antibiotic medicine as told by your health care provider. Do not stop using the antibiotic, even if your condition improves, unless directed by your health care provider. ?Take or apply over-the-counter and prescription medicines only as told by your health care provider. ?Be very careful to avoid touching the edge of your eyelid with the eye-drop bottle or the ointment tube when you apply medicines to the affected eye. This will keep you from spreading the infection to your other eye or to other people. ?Managing discomfort ?Gently wipe away any drainage from your eye with a warm, wet washcloth or a cotton ball. ?Apply a clean, cool compress to your eye for 10-20 minutes, 3-4 times a day. ?General instructions ?Do not wear contact lenses until the inflammation is gone and your health care provider says it is safe to wear them again. Ask your health care provider how to sterilize or replace your contact lenses before you use them again. Wear glasses until you can resume wearing contact lenses. ?Avoid wearing eye makeup until the inflammation is gone. Throw away any old eye cosmetics that may be contaminated. ?Change or wash your pillowcase every day. ?Do not share towels or washcloths. This may spread the infection. ?Wash your hands often with soap and water for at least 20 seconds and especially before touching your face or eyes. Use paper towels to dry your hands. ?Avoid touching or rubbing your eyes. ?Do not drive or use heavy machinery if your vision is blurred. ?Contact a health care provider if: ?You have a fever. ?Your symptoms do not get better after 10 days. ?Get help right away if: ?You have a  fever and your symptoms suddenly get worse. ?You have severe pain when you move your eye. ?You have facial pain, redness, or swelling. ?You have a sudden loss of vision. ?Summary ?Bacterial conjunctivitis is an infection of the clear membrane that covers the white part of the eye and the inner surface of the eyelid (conjunctiva). ?Bacterial conjunctivitis spreads easily from eye to eye and from person to person (is contagious). ?Wash your hands often with soap and water for at least 20 seconds and especially before touching your face or eyes. Use paper towels to dry your hands. ?Take or apply your antibiotic medicine as told by your health care provider. Do not stop using the antibiotic even if your condition improves. ?Contact a health care provider if you have a fever or if your symptoms do not get better after 10 days. Get help right away if you have a sudden loss of vision. ?This information is not intended to replace advice given to you by your health care provider. Make sure you discuss any questions you have with your health care provider. ?Document Revised: 07/30/2020  Document Reviewed: 07/30/2020 ?Elsevier Patient Education ? 2022 [employer] ? ? ? ?If you have been instructed to have an in-person evaluation today at a local Urgent Care facility, please use the link below. It will take you to a list of all of our available [facility], including address, phone number and hours of operation. Please do not delay care.  ?[facility] ? ?If you or a family member do not have a primary care provider, use the link below to schedule a visit and establish care. When you choose a [facility] primary care physician or advanced practice provider, you gain a long-term partner in health. ?Find a Primary Care Provider ? ?Learn more about [facility]'s in-office and virtual care options: ?[facility] - Get Care Now ?

## 2023-01-17 ENCOUNTER — Telehealth: Payer: Self-pay | Admitting: Physician Assistant

## 2023-01-17 DIAGNOSIS — J069 Acute upper respiratory infection, unspecified: Secondary | ICD-10-CM

## 2023-01-17 MED ORDER — FLUTICASONE PROPIONATE 50 MCG/ACT NA SUSP: 2.0000 | Freq: Every day | NASAL | 0 refills | Status: AC

## 2023-01-17 NOTE — Progress Notes (Signed)

## 2023-08-11 ENCOUNTER — Ambulatory Visit: Payer: Self-pay | Admitting: Adult Health

## 2023-08-22 ENCOUNTER — Encounter: Payer: Self-pay | Admitting: Obstetrics and Gynecology

## 2023-08-22 ENCOUNTER — Ambulatory Visit (INDEPENDENT_AMBULATORY_CARE_PROVIDER_SITE_OTHER): Payer: Self-pay | Admitting: Obstetrics and Gynecology

## 2023-08-22 ENCOUNTER — Other Ambulatory Visit (HOSPITAL_COMMUNITY)
Admission: RE | Admit: 2023-08-22 | Discharge: 2023-08-22 | Disposition: A | Discharge status: Home / Self Care | Payer: Self-pay | Source: Ambulatory Visit | Attending: Obstetrics and Gynecology | Admitting: Obstetrics and Gynecology

## 2023-08-22 VITALS — BP 118/83 | HR 75 | Ht 61.2 in | Wt 152.0 lb

## 2023-08-22 DIAGNOSIS — F419 Anxiety disorder, unspecified: Secondary | ICD-10-CM

## 2023-08-22 DIAGNOSIS — Z01419 Encounter for gynecological examination (general) (routine) without abnormal findings: Secondary | ICD-10-CM | POA: Insufficient documentation

## 2023-08-22 DIAGNOSIS — A6004 Herpesviral vulvovaginitis: Secondary | ICD-10-CM

## 2023-08-22 MED ORDER — ESCITALOPRAM OXALATE 10 MG PO TABS: 10.0000 mg | ORAL_TABLET | Freq: Every day | ORAL | 1 refills | Status: DC

## 2023-08-22 MED ORDER — VALACYCLOVIR HCL 500 MG PO TABS: 500.0000 mg | ORAL_TABLET | Freq: Every day | ORAL | 3 refills | Status: AC

## 2023-08-22 NOTE — Progress Notes (Signed)
 ANNUAL EXAM Patient name: Megan Leon MRN [id number]  Date of birth: 24-Feb-1991 Chief Complaint:   Gynecologic Exam (Last pap 02-01-2017 normal)  History of Present Illness:   Megan Leon is a 33 y.o. [phone]  female being seen today for a routine annual exam.  Current complaints: Contraception: currently none, coitus interruptus . Reports two periods month, beginning and end of month, around 20-21 days.Does not desire contraception   Is not established with primary care   Reports history of anxiety, and has increase significantly recently. She reports daily anxiety along with  irritable. She was previously on Lexapro  and reports she did well on this.       The pregnancy intention screening data noted above was reviewed. Potential methods of contraception were discussed. The patient elected to proceed with No Contraception Precautions.   Last pap 2020. Results were: NILM. H/O abnormal pap: no Last mammogram: n/a. Results were: N/A. Family h/o breast cancer:no, paternal side  Last colonoscopy: n/a. Results were: N/A. Family h/o colorectal cancer: no     08/22/2023    1:45 PM 07/31/2019   11:09 AM 10/05/2018   11:16 AM 06/16/2018    1:13 PM 05/15/2018    1:23 PM  Depression screen PHQ 2/9  Decreased Interest 2 0 0 1 0  Down, Depressed, Hopeless 3 0 0 1 0  PHQ - 2 Score 5 0 0 2 0  Altered sleeping 3 0 0 2 0  Tired, decreased energy 3 0 0 2 0  Change in appetite 3 0 0 2 0  Feeling bad or failure about yourself  2 0 0 0 0  Trouble concentrating 3 0 0 1 0  Moving slowly or fidgety/restless 1 0 0 0 0  Suicidal thoughts 0 0 0 0 0  PHQ-9 Score 20 0 0 9 0  Difficult doing work/chores     Not difficult at all        08/22/2023    1:46 PM 07/31/2019   11:10 AM 11/09/2017    2:22 PM  GAD 7 : Generalized Anxiety Score  Nervous, Anxious, on Edge 3 0 1  Control/stop worrying 3 0 1  Worry too much - different things 3 0 2  Trouble relaxing 3 0 1  Restless 3 0 1  Easily annoyed or  irritable 3 0 2  Afraid - awful might happen 1 0 1  Total GAD 7 Score 19 0 9  Anxiety Difficulty  Not difficult at all Somewhat difficult     Review of Systems:   Pertinent items are noted in HPI Denies any headaches, blurred vision, fatigue, shortness of breath, chest pain, abdominal pain, abnormal vaginal discharge/itching/odor/irritation, problems with periods, bowel movements, urination, or intercourse unless otherwise stated above. Pertinent History Reviewed:  Reviewed past medical,surgical, social and family history.  Reviewed problem list, medications and allergies. Physical Assessment:   Vitals:   08/22/23 1338  BP: 118/83  Pulse: 75  Weight: 152 lb (68.9 kg)  Height: 5' 1.2" (1.554 m)  Body mass index is 28.53 kg/m.        Physical Examination:   General appearance - well appearing, and in no distress  Mental status - alert, oriented   Psych:  She has a normal mood and affect  Skin - warm and dry, normal color, no suspicious lesions noted  Chest - effort normal, all lung fields clear to auscultation bilaterally  Heart - normal rate and regular rhythm  Neck:  midline trachea, no  thyromegaly   Breasts - breasts appear normal, no suspicious masses, no skin or nipple changes or  axillary nodes  Abdomen - soft, nontender, nondistended, no masses or organomegaly  Pelvic - VULVA: normal appearing vulva with no masses, tenderness or lesions  VAGINA: normal appearing vagina with normal color and discharge, no lesions  CERVIX: normal appearing cervix without discharge or lesions, no CMT  Thin prep pap is done w HR HPV cotesting  UTERUS: uterus is felt to be normal size, shwape, consistency and nontender   ADNEXA: No adnexal masses or tenderness noted.   Extremities:  No swelling or varicosities noted  Chaperone present for exam  No results found for this or any previous visit (from the past 24 hours).  Assessment & Plan:  1. Encounter for gynecological examination with  Papanicolaou smear of cervix (Primary) Encouraged routine self breast exams Not established with PCP yet, desires routine  blood work today - Cytology - PAP( [facility]) - CBC - Comp Met (CMET) - TSH - HgB A1c  2. Herpes simplex vulvovaginitis Hx, refill sent  - valACYclovir  (VALTREX ) 500 MG tablet; Take 1 tablet (500 mg total) by mouth daily.  Dispense: 90 tablet; Refill: 3  3. Anxiety Discussed restarting lexapro , will start 10mg  and follow up to reassess  - escitalopram  (LEXAPRO ) 10 MG tablet; Take 1 tablet (10 mg total) by mouth daily.  Dispense: 30 tablet; Refill: 1   Labs/procedures today:   Mammogram: @ 31yo, or sooner if problems Colonoscopy: @ 31yo, or sooner if problems  Orders Placed This Encounter  Procedures   CBC   Comp Met (CMET)   TSH   HgB A1c    Meds:  Meds ordered this encounter  Medications   valACYclovir  (VALTREX ) 500 MG tablet    Sig: Take 1 tablet (500 mg total) by mouth daily.    Dispense:  90 tablet    Refill:  3   escitalopram  (LEXAPRO ) 10 MG tablet    Sig: Take 1 tablet (10 mg total) by mouth daily.    Dispense:  30 tablet    Refill:  1    Follow-up: Return in about 4 weeks (around 09/19/2023), or mood follow up ( virtual if desire).  Megan Eric, FNP

## 2023-08-23 LAB — COMPREHENSIVE METABOLIC PANEL WITH GFR
ALT: 41 IU/L — ABNORMAL HIGH (ref 0–32)
AST: 23 IU/L (ref 0–40)
Albumin: 4.7 g/dL (ref 3.9–4.9)
Alkaline Phosphatase: 62 IU/L (ref 44–121)
BUN/Creatinine Ratio: 12 (ref 9–23)
BUN: 8 mg/dL (ref 6–20)
Bilirubin Total: 0.8 mg/dL (ref 0.0–1.2)
CO2: 22 mmol/L (ref 20–29)
Calcium: 9.6 mg/dL (ref 8.7–10.2)
Chloride: 102 mmol/L (ref 96–106)
Creatinine, Ser: 0.67 mg/dL (ref 0.57–1.00)
Globulin, Total: 2.2 g/dL (ref 1.5–4.5)
Glucose: 88 mg/dL (ref 70–99)
Potassium: 4 mmol/L (ref 3.5–5.2)
Sodium: 137 mmol/L (ref 134–144)
Total Protein: 6.9 g/dL (ref 6.0–8.5)
eGFR: 120 mL/min/{1.73_m2} (ref >59)

## 2023-08-23 LAB — CBC
Hematocrit: 41.6 % (ref 34.0–46.6)
Hemoglobin: 14 g/dL (ref 11.1–15.9)
MCH: 32.3 pg (ref 26.6–33.0)
MCHC: 33.7 g/dL (ref 31.5–35.7)
MCV: 96 fL (ref 79–97)
Platelets: 324 10*3/uL (ref 150–450)
RBC: 4.34 x10E6/uL (ref 3.77–5.28)
RDW: 12.2 % (ref 11.7–15.4)
WBC: 8.5 10*3/uL (ref 3.4–10.8)

## 2023-08-23 LAB — HEMOGLOBIN A1C
Est. average glucose Bld gHb Est-mCnc: 111 mg/dL
Hgb A1c MFr Bld: 5.5 % (ref 4.8–5.6)

## 2023-08-23 LAB — TSH: TSH: 0.373 u[IU]/mL — ABNORMAL LOW (ref 0.450–4.500)

## 2023-08-25 LAB — CYTOLOGY - PAP
Adequacy: ABSENT
Chlamydia: NEGATIVE
Comment: NEGATIVE
Comment: NEGATIVE
Comment: NORMAL
Diagnosis: NEGATIVE
High risk HPV: NEGATIVE
Neisseria Gonorrhea: NEGATIVE

## 2023-09-01 LAB — SPECIMEN STATUS REPORT

## 2023-09-02 LAB — SPECIMEN STATUS REPORT

## 2023-09-02 LAB — T3: T3, Total: 133 ng/dL (ref 71–180)

## 2023-09-02 LAB — T4: T4, Total: 7.6 ug/dL (ref 4.5–12.0)

## 2023-09-20 ENCOUNTER — Telehealth: Payer: Self-pay | Admitting: Obstetrics and Gynecology

## 2023-09-20 ENCOUNTER — Encounter: Payer: Self-pay | Admitting: Obstetrics and Gynecology

## 2023-09-20 VITALS — Ht 61.0 in | Wt 150.0 lb

## 2023-09-20 DIAGNOSIS — F419 Anxiety disorder, unspecified: Secondary | ICD-10-CM

## 2023-09-20 DIAGNOSIS — Z09 Encounter for follow-up examination after completed treatment for conditions other than malignant neoplasm: Secondary | ICD-10-CM

## 2023-09-20 NOTE — Progress Notes (Signed)
  I connected with Megan Leon: MyChart video and verified that I am speaking with the correct person using two identifiers.  Patient is located at home and provider is located at [facility]    By engaging in this virtual visit, you consent to the provision of healthcare.  Additionally, you authorize for your insurance to be billed for the services provided during this visit.            GYNECOLOGY PROGRESS NOTE  History:  33 y.o. [id number] presents to [facility] office today for mood follow up. She reports the Lexapro  has been helping a significantly. She is not having any side effects. She would like to stay on current dose  Health Maintenance Due  Topic Date Due   Hepatitis C Screening  Never done   DTaP/Tdap/Td (1 - Tdap) Never done   Pneumococcal Vaccine 83-25 Years old (2 of 2 - PCV) 06/12/2020   COVID-19 Vaccine (1 - 2024-25 season) Never done     Review of Systems:  Pertinent items are noted in HPI.   Objective:  Physical Exam Height 5\' 1"  (1.549 m), weight 150 lb (68 kg), last menstrual period 09/12/2023. Does not have BP cuff  Constitutional:no distress Pulm/chest wall: normal effort Neuro: alert and oriented  Psych: affect normal  Assessment & Plan:  1. Follow-up exam (Primary) 2. Anxiety Doing well on current lexapro  10mg , will continue current dose.   Return in 6 months for mood follow up and thyroid  follow up    Susi Eric, FNP

## 2023-10-11 ENCOUNTER — Telehealth: Payer: Self-pay | Admitting: Emergency Medicine

## 2023-10-11 DIAGNOSIS — R21 Rash and other nonspecific skin eruption: Secondary | ICD-10-CM

## 2023-10-11 DIAGNOSIS — W57XXXA Bitten or stung by nonvenomous insect and other nonvenomous arthropods, initial encounter: Secondary | ICD-10-CM

## 2023-10-11 MED ORDER — DOXYCYCLINE HYCLATE 100 MG PO TABS: 200.0000 mg | ORAL_TABLET | Freq: Once | ORAL | 0 refills | Status: AC

## 2023-10-11 NOTE — Progress Notes (Signed)
E-Visit for Tick Bite ? ?Thank you for describing your tick bite, Here is how we plan to help! ?Based on the information that you shared with me it looks like you have A tick that bite that we will treat with a short course of doxycycline. ? ?In most cases a tick bite is painless and does not itch.  Most tick bites in which the tick is quickly removed do not require prescriptions. ?Ticks can transmit several diseases if they are infected and remain attacked to your skin. ?Therefore the length that the tick was attached and any symptoms you have experienced after the bite are import to accurately develop your custom treatment plan. ?In most cases a single dose of doxycycline may prevent the development of a more serious condition. ? ?Based on your information I have Provided a home care guide for tick bites and  instructions on when to call for help. and I have sent a single dose of doxycycline to the pharmacy you selected. Please make sure that you selected a pharmacy that is open now. ? ?Which ticks  are associated with illness? ? ?The Wood Tick (dog tick) is the size of a watermelon seed and can sometimes transmit [facility] spotted fever and [place] tick fever.  ? ?The Deer Tick (black-legged tick) is between the size of a poppy seed (pin head) and an apple seed, and can sometimes transmit Lyme disease. ? ?A brown to black tick with a white splotch on its back is likely a female Amblyomma americanum ([place] tick). This tick has been associated with [place] Tick Associated illness ( STARI) ? ?Lyme disease has become the most common tick-borne illness in the [place]. The risk of Lyme disease following a recognized deer tick bite is estimated to be 1%.  ?The majority of cases of Lyme disease start with a bull's eye rash at the site of the tick bite. The rash can occur days to weeks (typically 7-10 days) after a tick bite. Treatment with antibiotics is indicated if this rash appears. Flu-like symptoms  may accompany the rash, including: fever, chills, headaches, muscle aches, and fatigue. ?Removing ticks promptly may prevent tick borne disease. ? ?What can be used to prevent Tick Bites? ? ?Insect repellant with at leas 20% DEET. ?Wearing long pants with sock and shoes. ?Avoiding tall grass and heavily wooded areas. ?Checking your skin after being outdoors. ?Shower with a washcloth after outdoor exposures. ? ?HOME CARE ADVICE FOR TICK BITE ? ?Wood Tick Removal:  ?Use a pair of tweezers and grasp the wood tick close to the skin (on its head). Pull the wood tick straight upward without twisting or crushing it. Maintain a steady pressure until it releases its grip.   ?If tweezers aren't available, use fingers, a loop of thread around the jaws, or a needle between the jaws for traction.  ?Note: covering the tick with petroleum jelly, nail polish or rubbing alcohol doesn't work. Neither does touching the tick with a hot or cold object. ?Tiny Deer Tick Removal:   ?Needs to be scraped off with a knife blade or credit card edge. ?Place tick in a sealed container (e.g. glass jar, zip lock plastic bag), in case your doctor wants to see it. ?Tick's Head Removal:  ?If the wood tick's head breaks off in the skin, it must be removed. Clean the skin. Then use a sterile needle to uncover the head and lift it out or scrape it off.  ?If a very small piece  of the head remains, the skin will eventually slough it off. ?Antibiotic Ointment:  ?Wash the wound and your hands with soap and water after removal to prevent catching any tick disease.  Apply an over the counter antibiotic ointment (e.g. bacitracin) to the bite once. ?Expected Course: Tick bites normally don't itch or hurt. That's why they often go unnoticed. ?Call Your Doctor If:  ?You can't remove the tick or the tick's head ?Fever, a severe head ache, or rash occur in the next 2 weeks ?Bite begins to look infected ?Lyme?s disease is common in your area ?You have not had a  tetanus in the last 10 years ?Your current symptoms become worse  ? ? ?MAKE SURE YOU  ?Understand these instructions. ?Will watch your condition. ?Will get help right away if you are not doing well or get worse. ? ? ? ?Thank you for choosing an e-visit. ? ?Your e-visit answers were reviewed by a board certified advanced clinical practitioner to complete your personal care plan. Depending upon the condition, your plan could have included both over the counter or prescription medications. ? ?Please review your pharmacy choice. Make sure the pharmacy is open so you can pick up prescription now. If there is a problem, you may contact your provider through [employer] and have the prescription routed to another pharmacy.  Your safety is important to [place]. If you have drug allergies check your prescription carefully.  ? ?For the next 24 hours you can use MyChart to ask questions about today's visit, request a non-urgent call back, or ask for a work or school excuse. ?You will get an email in the next two days asking about your experience. I hope that your e-visit has been valuable and will speed your recovery. ? ?I have spent 5 minutes in review of e-visit questionnaire, review and updating patient chart, medical decision making and response to patient.  ? ?Willeen Cass, PhD, FNP-BC ?  ?

## 2023-11-03 ENCOUNTER — Other Ambulatory Visit: Payer: Self-pay | Admitting: Obstetrics & Gynecology

## 2023-11-03 DIAGNOSIS — F419 Anxiety disorder, unspecified: Secondary | ICD-10-CM
